# Patient Record
Sex: Male | Born: 1945 | Race: White | Hispanic: No | Marital: Married | Smoking: Former smoker
Health system: Southern US, Community
[De-identification: ages and names within clinical notes are randomized; demographics above are authoritative.]

## PROBLEM LIST (undated history)

## (undated) DIAGNOSIS — J449 Chronic obstructive pulmonary disease, unspecified: Secondary | ICD-10-CM

## (undated) DIAGNOSIS — I10 Essential (primary) hypertension: Secondary | ICD-10-CM

## (undated) HISTORY — PX: CLAVICLE SURGERY: SHX598

---

## 2014-02-11 DIAGNOSIS — J410 Simple chronic bronchitis: Secondary | ICD-10-CM | POA: Diagnosis not present

## 2014-08-21 ENCOUNTER — Ambulatory Visit: Payer: Self-pay

## 2014-08-21 DIAGNOSIS — R059 Cough, unspecified: Secondary | ICD-10-CM | POA: Diagnosis not present

## 2014-08-21 DIAGNOSIS — R5383 Other fatigue: Secondary | ICD-10-CM | POA: Diagnosis not present

## 2014-08-21 DIAGNOSIS — R5381 Other malaise: Secondary | ICD-10-CM | POA: Diagnosis not present

## 2014-08-21 DIAGNOSIS — R0602 Shortness of breath: Secondary | ICD-10-CM | POA: Diagnosis not present

## 2014-08-21 DIAGNOSIS — I10 Essential (primary) hypertension: Secondary | ICD-10-CM | POA: Diagnosis not present

## 2014-08-21 DIAGNOSIS — R05 Cough: Secondary | ICD-10-CM | POA: Diagnosis not present

## 2014-08-21 DIAGNOSIS — J069 Acute upper respiratory infection, unspecified: Secondary | ICD-10-CM | POA: Diagnosis not present

## 2014-08-27 DIAGNOSIS — R0602 Shortness of breath: Secondary | ICD-10-CM | POA: Diagnosis not present

## 2014-08-27 DIAGNOSIS — R5383 Other fatigue: Secondary | ICD-10-CM | POA: Diagnosis not present

## 2014-08-27 DIAGNOSIS — R5381 Other malaise: Secondary | ICD-10-CM | POA: Diagnosis not present

## 2014-08-27 DIAGNOSIS — Z125 Encounter for screening for malignant neoplasm of prostate: Secondary | ICD-10-CM | POA: Diagnosis not present

## 2014-08-27 DIAGNOSIS — Z0000 Encounter for general adult medical examination without abnormal findings: Secondary | ICD-10-CM | POA: Diagnosis not present

## 2014-09-18 DIAGNOSIS — R0602 Shortness of breath: Secondary | ICD-10-CM | POA: Diagnosis not present

## 2014-10-02 DIAGNOSIS — I10 Essential (primary) hypertension: Secondary | ICD-10-CM | POA: Diagnosis not present

## 2014-10-02 DIAGNOSIS — R0602 Shortness of breath: Secondary | ICD-10-CM | POA: Diagnosis not present

## 2014-10-02 DIAGNOSIS — J449 Chronic obstructive pulmonary disease, unspecified: Secondary | ICD-10-CM | POA: Diagnosis not present

## 2014-10-02 DIAGNOSIS — J069 Acute upper respiratory infection, unspecified: Secondary | ICD-10-CM | POA: Diagnosis not present

## 2014-10-15 DIAGNOSIS — R0602 Shortness of breath: Secondary | ICD-10-CM | POA: Diagnosis not present

## 2014-10-15 DIAGNOSIS — I10 Essential (primary) hypertension: Secondary | ICD-10-CM | POA: Diagnosis not present

## 2014-10-15 DIAGNOSIS — J449 Chronic obstructive pulmonary disease, unspecified: Secondary | ICD-10-CM | POA: Diagnosis not present

## 2014-10-15 DIAGNOSIS — R5381 Other malaise: Secondary | ICD-10-CM | POA: Diagnosis not present

## 2014-11-05 DIAGNOSIS — H33191 Other retinoschisis and retinal cysts, right eye: Secondary | ICD-10-CM | POA: Diagnosis not present

## 2014-11-05 DIAGNOSIS — H04123 Dry eye syndrome of bilateral lacrimal glands: Secondary | ICD-10-CM | POA: Diagnosis not present

## 2014-11-05 DIAGNOSIS — H2513 Age-related nuclear cataract, bilateral: Secondary | ICD-10-CM | POA: Diagnosis not present

## 2014-11-05 DIAGNOSIS — H5213 Myopia, bilateral: Secondary | ICD-10-CM | POA: Diagnosis not present

## 2014-11-19 DIAGNOSIS — I10 Essential (primary) hypertension: Secondary | ICD-10-CM | POA: Diagnosis not present

## 2014-11-19 DIAGNOSIS — J449 Chronic obstructive pulmonary disease, unspecified: Secondary | ICD-10-CM | POA: Diagnosis not present

## 2014-11-19 DIAGNOSIS — R0602 Shortness of breath: Secondary | ICD-10-CM | POA: Diagnosis not present

## 2015-03-20 DIAGNOSIS — G4710 Hypersomnia, unspecified: Secondary | ICD-10-CM | POA: Diagnosis not present

## 2015-03-20 DIAGNOSIS — I10 Essential (primary) hypertension: Secondary | ICD-10-CM | POA: Diagnosis not present

## 2015-03-20 DIAGNOSIS — J449 Chronic obstructive pulmonary disease, unspecified: Secondary | ICD-10-CM | POA: Diagnosis not present

## 2015-04-15 DIAGNOSIS — J449 Chronic obstructive pulmonary disease, unspecified: Secondary | ICD-10-CM | POA: Diagnosis not present

## 2015-04-15 DIAGNOSIS — G4710 Hypersomnia, unspecified: Secondary | ICD-10-CM | POA: Diagnosis not present

## 2015-04-15 DIAGNOSIS — I10 Essential (primary) hypertension: Secondary | ICD-10-CM | POA: Diagnosis not present

## 2015-04-22 DIAGNOSIS — G4710 Hypersomnia, unspecified: Secondary | ICD-10-CM | POA: Diagnosis not present

## 2015-05-01 DIAGNOSIS — J449 Chronic obstructive pulmonary disease, unspecified: Secondary | ICD-10-CM | POA: Diagnosis not present

## 2015-05-01 DIAGNOSIS — I10 Essential (primary) hypertension: Secondary | ICD-10-CM | POA: Diagnosis not present

## 2015-05-01 DIAGNOSIS — G4710 Hypersomnia, unspecified: Secondary | ICD-10-CM | POA: Diagnosis not present

## 2015-05-06 DIAGNOSIS — R0602 Shortness of breath: Secondary | ICD-10-CM | POA: Diagnosis not present

## 2015-05-06 DIAGNOSIS — I10 Essential (primary) hypertension: Secondary | ICD-10-CM | POA: Diagnosis not present

## 2015-05-06 DIAGNOSIS — G4710 Hypersomnia, unspecified: Secondary | ICD-10-CM | POA: Diagnosis not present

## 2015-05-06 DIAGNOSIS — J449 Chronic obstructive pulmonary disease, unspecified: Secondary | ICD-10-CM | POA: Diagnosis not present

## 2015-05-06 DIAGNOSIS — J069 Acute upper respiratory infection, unspecified: Secondary | ICD-10-CM | POA: Diagnosis not present

## 2015-05-22 DIAGNOSIS — J449 Chronic obstructive pulmonary disease, unspecified: Secondary | ICD-10-CM | POA: Diagnosis not present

## 2016-04-12 IMAGING — CR DG CHEST 2V
1 series · 2 of 2 positions shown · non-contrast
Comparison: None.

CLINICAL DATA: Productive cough and shortness of breath.

EXAM:
CHEST  2 VIEW

[Series 1: kdxr chest pa (or ap) and lat · 0.14mm/px · 2 of 2 slices shown]
[im 1/2]
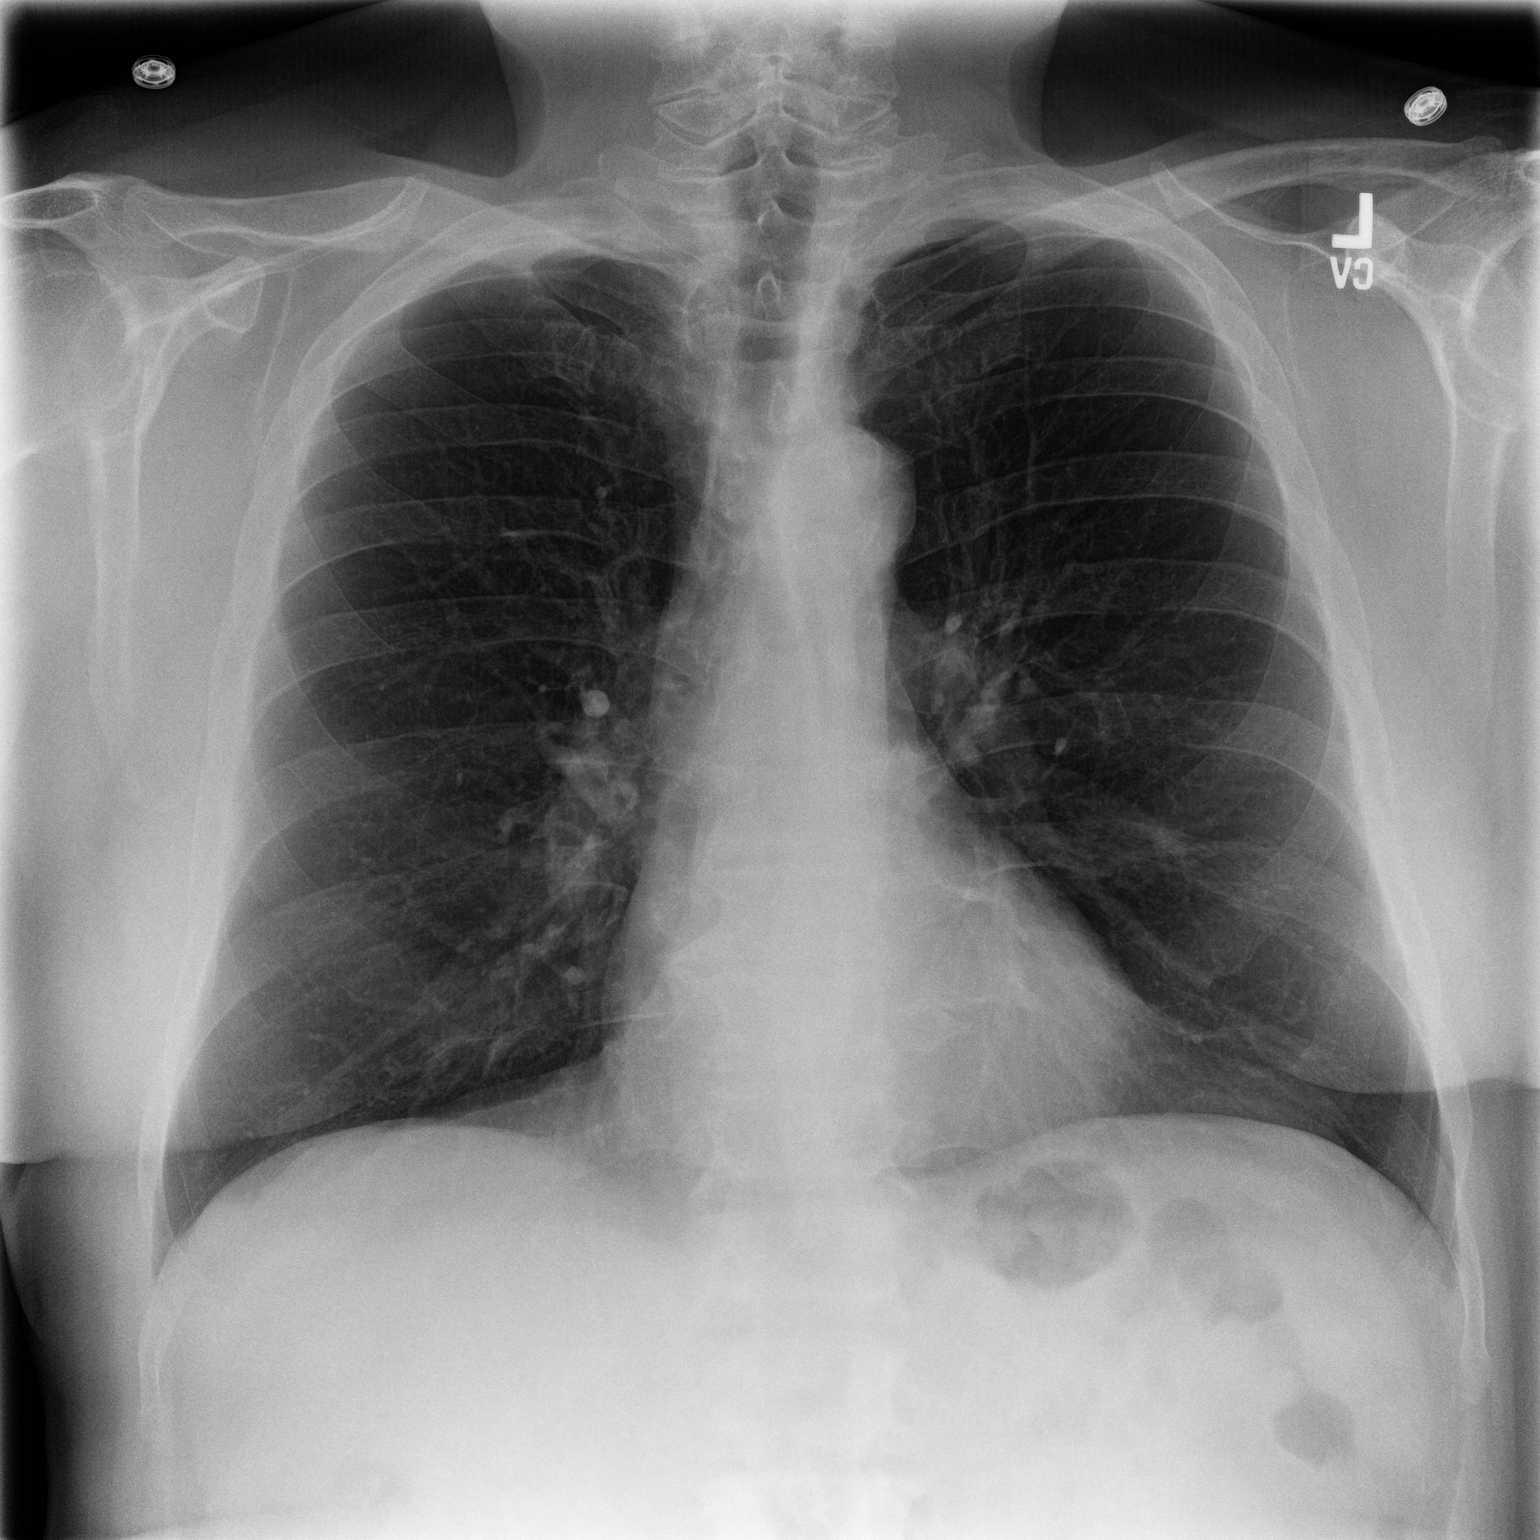
[im 2/2]
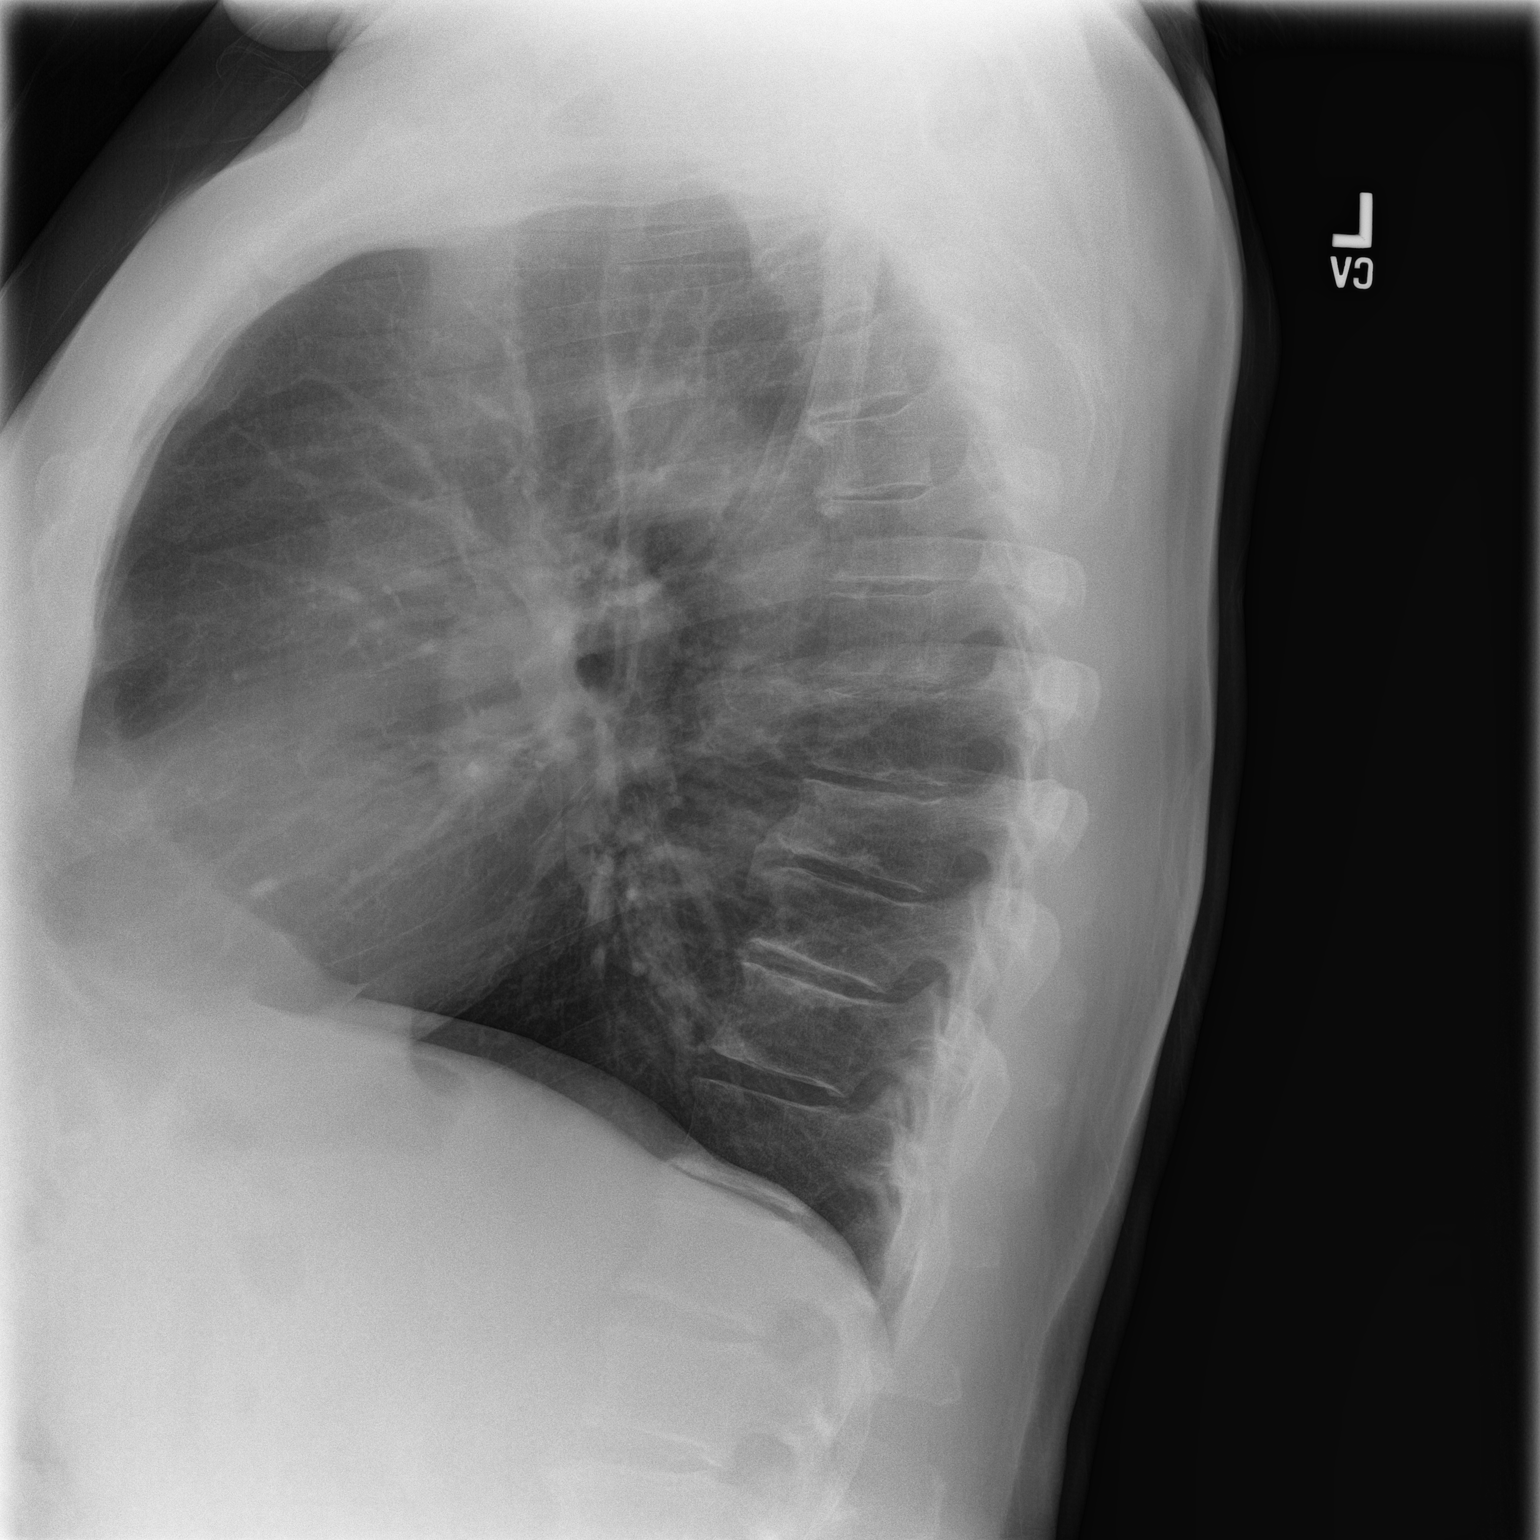

[2 of 2 positions shown; findings below may reference images not displayed]

FINDINGS: The heart size and mediastinal contours are within normal limits.
Both lungs are clear. There is mild degenerative change of the
spine.
IMPRESSION: No active cardiopulmonary disease.

## 2017-12-16 DIAGNOSIS — D2239 Melanocytic nevi of other parts of face: Secondary | ICD-10-CM | POA: Diagnosis not present

## 2017-12-16 DIAGNOSIS — L821 Other seborrheic keratosis: Secondary | ICD-10-CM | POA: Diagnosis not present

## 2017-12-16 DIAGNOSIS — L720 Epidermal cyst: Secondary | ICD-10-CM | POA: Diagnosis not present

## 2017-12-16 DIAGNOSIS — L578 Other skin changes due to chronic exposure to nonionizing radiation: Secondary | ICD-10-CM | POA: Diagnosis not present

## 2017-12-16 DIAGNOSIS — L570 Actinic keratosis: Secondary | ICD-10-CM | POA: Diagnosis not present

## 2017-12-16 DIAGNOSIS — L918 Other hypertrophic disorders of the skin: Secondary | ICD-10-CM | POA: Diagnosis not present

## 2022-03-29 ENCOUNTER — Encounter (HOSPITAL_COMMUNITY): Payer: Self-pay | Admitting: Emergency Medicine

## 2022-03-29 ENCOUNTER — Other Ambulatory Visit: Payer: Self-pay

## 2022-03-29 ENCOUNTER — Emergency Department (HOSPITAL_COMMUNITY)
Admission: EM | Admit: 2022-03-29 | Discharge: 2022-03-29 | Disposition: A | Discharge status: Home / Self Care | Payer: Medicare HMO | Attending: Emergency Medicine | Admitting: Emergency Medicine

## 2022-03-29 DIAGNOSIS — I10 Essential (primary) hypertension: Secondary | ICD-10-CM | POA: Diagnosis not present

## 2022-03-29 DIAGNOSIS — J449 Chronic obstructive pulmonary disease, unspecified: Secondary | ICD-10-CM | POA: Diagnosis not present

## 2022-03-29 DIAGNOSIS — I159 Secondary hypertension, unspecified: Secondary | ICD-10-CM

## 2022-03-29 DIAGNOSIS — R232 Flushing: Secondary | ICD-10-CM | POA: Diagnosis present

## 2022-03-29 HISTORY — DX: Chronic obstructive pulmonary disease, unspecified: J44.9

## 2022-03-29 HISTORY — DX: Essential (primary) hypertension: I10

## 2022-03-29 NOTE — Discharge Instructions (Signed)
You have been seen and discharged from the emergency department.  Your blood pressure has down trended.  It is very important that you are compliant with your nighttime blood pressure medication dose to control morning hypertension.  Follow-up with your primary provider for further evaluation and further care. Take home medications as prescribed. If you have any worsening symptoms or further concerns for your health please return to an emergency department for further evaluation. ?

## 2022-03-29 NOTE — ED Triage Notes (Signed)
Pt reports waking up this morning and feeling flushed/ "not right". Took BP at 5am and noted it was 210/80. Took amlodipine '10mg'$  po at 5:30. Seen at [facility] for bp management. Started recently on '50mg'$  of losartan at night, but still having difficulty managing bp. Denies sudden headache or altered vision.  ?

## 2022-03-29 NOTE — ED Provider Notes (Signed)
?[facility] ?Provider Note ? ? ?CSN: [id number] ?Arrival date & time: 03/29/22  3570 ? ?  ? ?History ? ?Chief Complaint  ?Patient presents with  ? Hypertension  ? ? ?Marvin Grabill is a 76 y.o. male. ? ?HPI ? ?76 year old male with past medical history of COPD, HTN presents emergency department after episode of feeling flushed.  Patient states when he woke up this morning at 5 AM to use the restroom he felt flushed.  Patient has been seen at the [facility] for hypertensive management.  He has recently been started on losartan at night which she has admittedly not been compliant with.  Patient states when he took his blood pressure this morning when he was feeling "flushed" that it was 210/80.  He denies any acute headache, focal neurologic symptoms, chest pain or shortness of breath.  He took his morning dose of amlodipine and came in for evaluation.  Currently he is asymptomatic with no acute complaints.  No recent fever or illness. ? ?Home Medications ?Prior to Admission medications   ?Not on File  ?   ? ?Allergies    ?Patient has no known allergies.   ? ?Review of Systems   ?Review of Systems  ?Constitutional:  Negative for fever.  ?Respiratory:  Negative for shortness of breath.   ?Cardiovascular:  Negative for chest pain, palpitations and leg swelling.  ?Gastrointestinal:  Negative for abdominal pain, diarrhea and vomiting.  ?Skin:  Negative for rash.  ?Neurological:  Negative for facial asymmetry, speech difficulty, weakness, numbness and headaches.  ? ?Physical Exam ?Updated Vital Signs ?BP (!) 171/71 (BP Location: Right Arm)   Pulse 66   Temp 98.6 ?F (37 ?C) (Oral)   Resp (!) 9   Ht '5\' 9"'$  (1.753 m)   Wt 92.5 kg   SpO2 99%   BMI 30.13 kg/m?  ?Physical Exam ?Vitals and nursing note reviewed.  ?Constitutional:   ?   General: He is not in acute distress. ?   Appearance: Normal appearance. He is not diaphoretic.  ?HENT:  ?   Head: Normocephalic.  ?   Mouth/Throat:  ?   Mouth: Mucous  membranes are moist.  ?Cardiovascular:  ?   Rate and Rhythm: Normal rate.  ?Pulmonary:  ?   Effort: Pulmonary effort is normal. No respiratory distress.  ?Abdominal:  ?   Palpations: Abdomen is soft.  ?   Tenderness: There is no abdominal tenderness.  ?Musculoskeletal:     ?   General: No swelling.  ?Skin: ?   General: Skin is warm.  ?Neurological:  ?   General: No focal deficit present.  ?   Mental Status: He is alert and oriented to person, place, and time. Mental status is at baseline.  ?Psychiatric:     ?   Mood and Affect: Mood normal.  ? ? ?ED Results / Procedures / Treatments   ?Labs ?(all labs ordered are listed, but only abnormal results are displayed) ?Labs Reviewed - No data to display ? ?EKG ?EKG Interpretation ? ?Date/Time:  Monday March 29 2022 07:32:28 EDT ?Ventricular Rate:  66 ?PR Interval:  194 ?QRS Duration: 120 ?QT Interval:  407 ?QTC Calculation: 427 ?R Axis:   101 ?Text Interpretation: Sinus rhythm Ventricular premature complex IVCD, consider atypical RBBB Anterior infarct, old Baseline wander in lead(s) V5 Confirmed by Lavenia Atlas [phone]) on 03/29/2022 9:23:47 AM ? ?Radiology ?No results found. ? ?Procedures ?Procedures  ? ? ?Medications Ordered in ED ?Medications - No data to display ? ?  ED Course/ Medical Decision Making/ A&P ?  ?                        ?Medical Decision Making ? ?76 year old male presents emergency department for evaluation of HTN.  Recently started on nightly dose of losartan, he has admittedly been noncompliant with this.  States he woke up this morning, felt flushed, blood pressure was elevated with systolic over 993.  No acute symptoms in regards to the HTN including headache, neuro symptoms/deficit, chest pain, shortness of breath.  He is otherwise been in his usual state of health. ? ?He took a dose of his amlodipine prior to arrival, came in for evaluation.  Blood pressure has appropriately down trended without any intervention here in the department.  Currently he  is 570 systolic.  Again is asymptomatic with no complaints.  EKG shows PVC and wandering baseline, nonspecific ST changes but no other acute findings.  No active chest pain, low suspicion for ACS. ? ?Patient at this time appears safe and stable for discharge and close outpatient follow up. Discharge plan and strict return to ED precautions discussed, patient verbalizes understanding and agreement. ? ? ? ? ? ? ? ?Final Clinical Impression(s) / ED Diagnoses ?Final diagnoses:  ?Secondary hypertension  ? ? ?Rx / DC Orders ?ED Discharge Orders   ? ? None  ? ?  ? ? ?  ?Lorelle Gibbs, DO ?03/29/22 1779 ? ?

## 2022-10-22 ENCOUNTER — Other Ambulatory Visit: Payer: Self-pay | Admitting: Family Medicine

## 2022-10-22 DIAGNOSIS — R0989 Other specified symptoms and signs involving the circulatory and respiratory systems: Secondary | ICD-10-CM

## 2022-11-01 ENCOUNTER — Ambulatory Visit (HOSPITAL_COMMUNITY)
Admission: RE | Admit: 2022-11-01 | Discharge: 2022-11-01 | Disposition: A | Discharge status: Home / Self Care | Payer: No Typology Code available for payment source | Source: Ambulatory Visit | Attending: Family Medicine | Admitting: Family Medicine

## 2022-11-01 DIAGNOSIS — R0989 Other specified symptoms and signs involving the circulatory and respiratory systems: Secondary | ICD-10-CM | POA: Insufficient documentation

## 2022-11-06 ENCOUNTER — Other Ambulatory Visit: Payer: Self-pay

## 2022-11-06 ENCOUNTER — Emergency Department (HOSPITAL_COMMUNITY)
Admission: EM | Admit: 2022-11-06 | Discharge: 2022-11-06 | Disposition: A | Discharge status: Home / Self Care | Payer: No Typology Code available for payment source | Attending: Emergency Medicine | Admitting: Emergency Medicine

## 2022-11-06 ENCOUNTER — Emergency Department (HOSPITAL_COMMUNITY): Payer: No Typology Code available for payment source

## 2022-11-06 ENCOUNTER — Encounter (HOSPITAL_COMMUNITY): Payer: Self-pay

## 2022-11-06 DIAGNOSIS — U071 COVID-19: Secondary | ICD-10-CM | POA: Insufficient documentation

## 2022-11-06 DIAGNOSIS — I10 Essential (primary) hypertension: Secondary | ICD-10-CM | POA: Diagnosis not present

## 2022-11-06 DIAGNOSIS — I160 Hypertensive urgency: Secondary | ICD-10-CM | POA: Diagnosis not present

## 2022-11-06 DIAGNOSIS — J449 Chronic obstructive pulmonary disease, unspecified: Secondary | ICD-10-CM | POA: Insufficient documentation

## 2022-11-06 DIAGNOSIS — J029 Acute pharyngitis, unspecified: Secondary | ICD-10-CM | POA: Diagnosis present

## 2022-11-06 LAB — CBC
HCT: 44.8 % (ref 39.0–52.0)
Hemoglobin: 15.4 g/dL (ref 13.0–17.0)
MCH: 32.3 pg (ref 26.0–34.0)
MCHC: 34.4 g/dL (ref 30.0–36.0)
MCV: 93.9 fL (ref 80.0–100.0)
Platelets: 238 10*3/uL (ref 150–400)
RBC: 4.77 MIL/uL (ref 4.22–5.81)
RDW: 12.6 % (ref 11.5–15.5)
WBC: 8.9 10*3/uL (ref 4.0–10.5)
nRBC: 0 % (ref 0.0–0.2)

## 2022-11-06 LAB — BASIC METABOLIC PANEL
Anion gap: 10 (ref 5–15)
BUN: 17 mg/dL (ref 8–23)
CO2: 24 mmol/L (ref 22–32)
Calcium: 8.8 mg/dL — ABNORMAL LOW (ref 8.9–10.3)
Chloride: 97 mmol/L — ABNORMAL LOW (ref 98–111)
Creatinine, Ser: 0.87 mg/dL (ref 0.61–1.24)
GFR, Estimated: >60 mL/min (ref >60)
Glucose, Bld: 110 mg/dL — ABNORMAL HIGH (ref 70–99)
Potassium: 3.3 mmol/L — ABNORMAL LOW (ref 3.5–5.1)
Sodium: 131 mmol/L — ABNORMAL LOW (ref 135–145)

## 2022-11-06 LAB — RESP PANEL BY RT-PCR (FLU A&B, COVID) ARPGX2
Influenza A by PCR: NEGATIVE
Influenza B by PCR: NEGATIVE
SARS Coronavirus 2 by RT PCR: POSITIVE — AB

## 2022-11-06 LAB — TROPONIN I (HIGH SENSITIVITY)
Troponin I (High Sensitivity): 5 ng/L (ref <18)
Troponin I (High Sensitivity): 6 ng/L (ref <18)

## 2022-11-06 MED ORDER — AMLODIPINE BESYLATE 5 MG PO TABS: 10.0000 mg | ORAL_TABLET | Freq: Once | ORAL | Status: DC

## 2022-11-06 MED ORDER — SODIUM CHLORIDE 0.9% FLUSH: 3.0000 mL | Freq: Once | INTRAVENOUS | Status: DC

## 2022-11-06 NOTE — Discharge Instructions (Signed)
You have been evaluated for your symptoms.  Your blood pressure is high today, sometimes that can cause chest discomfort.  Please discuss this with your doctor and resume taking blood pressure medication to help with your symptoms.  You also tested positive for COVID infection.  Follow instruction below.  Recommendations for at home COVID-19 symptoms management:  Please continue isolation at home.  If have acute worsening of symptoms please go to ER/urgent care for further evaluation. Check pulse oximetry and if below 90-92% please go to ER. The following supplements MAY help:  Vitamin C '500mg'$  twice a day and Quercetin 250-500 mg twice a day Vitamin D3 2000 - 4000 u/day B Complex vitamins Zinc 75-100 mg/day Melatonin 6-10 mg at night (the optimal dose is unknown) Aspirin '81mg'$ /day (if no history of bleeding issues)

## 2022-11-06 NOTE — ED Triage Notes (Signed)
Patient arrives to ED from home c/o chest pain in the middle of his chest that started earlier today around 1130 this morning. Pt states he "feels like indigestion or gas bubble" in the middle of his chest. Pt a&o x4, NAD. Pt has hx of HTN.

## 2022-11-06 NOTE — ED Provider Notes (Signed)
[facility] EMERGENCY DEPARTMENT Provider Note   CSN: [id number] Arrival date & time: 11/06/22  1656     History  Chief Complaint  Patient presents with   Chest Pain    Curtis Sullivan is a 76 y.o. male.  The history is provided by the patient and medical records. No language interpreter was used.  Chest Pain    76 year old male significant history of hypertension, COPD, presenting with complaints of chest pain.  Patient mention he experiencing some indigestion throughout the day today.  He described more as a gas-like bubble in his mid chest that was causing discomfort.  After drinking a soda and belching resolved.  Symptoms symptoms came back again an hour later prompting this ER visit.  Now that he is here he mention his symptom has since resolved.  He did not endorse any associated lightheadedness, dizziness, nausea, shortness of breath, diaphoresis.  He mention he has had similar episodes like this in the past.  He denies any exertional chest pain.  He does not smoke or drink, he has not had a stress test in quite a while he reported he is currently symptom-free.  He did report having some flulike symptoms earlier this morning with sore throat and some headache.  He rinse his throat with water and salt and report sore throat resolved as well.  Home Medications Prior to Admission medications   Not on File      Allergies    Patient has no known allergies.    Review of Systems   Review of Systems  Cardiovascular:  Positive for chest pain.  All other systems reviewed and are negative.   Physical Exam Updated Vital Signs BP (!) 165/80   Pulse 80   Temp 99.7 F (37.6 C) (Oral)   Resp (!) 26   Ht 5\' 9"  (1.753 m)   Wt 86.2 kg   SpO2 92%   BMI 28.06 kg/m  Physical Exam Vitals and nursing note reviewed.  Constitutional:      General: He is not in acute distress.    Appearance: He is well-developed.  HENT:     Head: Atraumatic.  Eyes:     Conjunctiva/sclera:  Conjunctivae normal.  Cardiovascular:     Rate and Rhythm: Normal rate and regular rhythm.     Pulses: Normal pulses.     Heart sounds: Normal heart sounds.  Pulmonary:     Effort: Pulmonary effort is normal.     Breath sounds: Normal breath sounds.  Abdominal:     Palpations: Abdomen is soft.     Tenderness: There is no abdominal tenderness.  Musculoskeletal:     Cervical back: Neck supple.     Right lower leg: No edema.     Left lower leg: No edema.  Skin:    Findings: No rash.  Neurological:     Mental Status: He is alert. Mental status is at baseline.     ED Results / Procedures / Treatments   Labs (all labs ordered are listed, but only abnormal results are displayed) Labs Reviewed  RESP PANEL BY RT-PCR (FLU A&B, COVID) ARPGX2 - Abnormal; Notable for the following components:      Result Value   SARS Coronavirus 2 by RT PCR POSITIVE (*)    All other components within normal limits  BASIC METABOLIC PANEL - Abnormal; Notable for the following components:   Sodium 131 (*)    Potassium 3.3 (*)    Chloride 97 (*)  Glucose, Bld 110 (*)    Calcium 8.8 (*)    All other components within normal limits  CBC  TROPONIN I (HIGH SENSITIVITY)  TROPONIN I (HIGH SENSITIVITY)    EKG EKG Interpretation  Date/Time:  Saturday November 06 2022 17:21:19 EST Ventricular Rate:  85 PR Interval:  212 QRS Duration: 120 QT Interval:  374 QTC Calculation: 445 R Axis:   11 Text Interpretation: Sinus rhythm with 1st degree A-V block Septal infarct , age undetermined Abnormal ECG When compared with ECG of 29-Mar-2022 07:32, PREVIOUS ECG IS PRESENT No acute changes No significant change since last tracing Confirmed by Derwood Kaplan [phone]) on 11/06/2022 6:12:00 PM  Radiology DG Chest 2 View  Result Date: 11/06/2022 CLINICAL DATA:  Chest pain EXAM: CHEST - 2 VIEW COMPARISON:  08/21/2014 FINDINGS: Heart and mediastinal contours are within normal limits. No focal opacities or effusions.  No acute bony abnormality. IMPRESSION: No active cardiopulmonary disease. Electronically Signed   By: Charlett Nose M.D.   On: 11/06/2022 20:24    Procedures Procedures    Medications Ordered in ED Medications  sodium chloride flush (NS) 0.9 % injection 3 mL (3 mLs Intravenous Not Given 11/06/22 1729)  amLODipine (NORVASC) tablet 10 mg (10 mg Oral Patient Refused/Not Given 11/06/22 2007)    ED Course/ Medical Decision Making/ A&P                           Medical Decision Making Amount and/or Complexity of Data Reviewed Labs: ordered. Radiology: ordered.  Risk Prescription drug management.   BP (!) 165/80   Pulse 80   Temp 99.7 F (37.6 C) (Oral)   Resp (!) 26   Ht 5\' 9"  (1.753 m)   Wt 86.2 kg   SpO2 92%   BMI 28.06 kg/m   59:79 PM  76 year old male significant history of hypertension, COPD, presenting with complaints of chest pain.  Patient mention he experiencing some indigestion throughout the day today.  He described more as a gas-like bubble in his mid chest that was causing discomfort.  After drinking a soda and belching resolved.  Symptoms symptoms came back again an hour later prompting this ER visit.  Now that he is here he mention his symptom has since resolved.  He did not endorse any associated lightheadedness, dizziness, nausea, shortness of breath, diaphoresis.  He mention he has had similar episodes like this in the past.  He denies any exertional chest pain.  He does not smoke or drink, he has not had a stress test in quite a while he reported he is currently symptom-free.  He did report having some flulike symptoms earlier this morning with sore throat and some headache.  He rinse his throat with water and salt and report sore throat resolved as well.  On exam this is a well-appearing elderly male appears to be in no acute discomfort.  No reproducible chest pain.  Heart lung sounds normal, abdomen is soft nontender.  No evidence of peripheral edema.  Vitals are  remarkable for elevated blood pressure 165/80.  Oral temperature is 99.7.  O2 sats 92%.  -Labs ordered, independently viewed and interpreted by me.  Labs remarkable for Covid positive, normal delta trop doubt ACS causing his sxs -The patient was maintained on a cardiac monitor.  I personally viewed and interpreted the cardiac monitored which showed an underlying rhythm of: NSR -Imaging independently viewed and interpreted by me and I agree with  radiologist's interpretation.  Result remarkable for CXR showing no focal infiltrates -This patient presents to the ED for concern of CP, this involves an extensive number of treatment options, and is a complaint that carries with it a high risk of complications and morbidity.  The differential diagnosis includes COVID, costochondritis, pna, pleural effusion, acs, PE -Co morbidities that complicate the patient evaluation includes HTN, COPD -Treatment offered include amlodipine and IVF but pt declined -Reevaluation of the patient after these medicines showed that the patient stayed the same -PCP office notes or outside notes reviewed -Discussion with attending DR. Nanavati -Escalation to admission/observation considered: patients feels much better, is comfortable with discharge, and will follow up with PCP -Prescription medication considered, patient comfortable with OTC meds -Social Determinant of Health considered which includes tobacco use  9:42 PM Work-up today is remarkable for positive COVID infection.  Chest x-ray without signs of pneumonia, vital signs stable, delta troponin negative doubt ACS.  Since patient is a good candidate for Paxlovid antiviral medication I did discuss this with patient but he declined the medication.  Patient was noted to have elevated blood pressure as high as 206 systolic.  On recheck it has improved to 165 systolic.  I encourage patient to follow-up with his doctor and resume his blood pressure medication as he no longer take  amlodipine that was previously prescribed.  Patient states he does not like the side effect of the medication.  I encouraged patient to try other medication.  I also offer medication for better control of his uncontrolled hypertension but patient declined.  [facility]  Return precaution given.  Recommend self quarantine.         Final Clinical Impression(s) / ED Diagnoses Final diagnoses:  COVID-19 virus infection  Hypertensive urgency    Rx / DC Orders ED Discharge Orders     None         Fayrene Helper, PA-C 11/06/22 2145    Derwood Kaplan, MD 11/08/22 0001

## 2022-11-09 ENCOUNTER — Telehealth: Payer: Self-pay | Admitting: *Deleted

## 2022-11-09 NOTE — Telephone Encounter (Signed)
     Patient  visit on 11/06/2022  at [facility]  was for covid 19   Have you been able to follow up with your primary care physician? Patient was Covid positive and is feeling better and has no more fever and headache is resolved will see the dr in [place] about his BP  The patient was able to obtain any needed medicine or equipment.  Are there diet recommendations that you are having difficulty following?  Patient expresses understanding of discharge instructions and education provided has no other needs at this time.   [facility] [phone] [address] , [place] [address] Email : [email or web address]

## 2024-06-02 ENCOUNTER — Other Ambulatory Visit: Payer: Self-pay

## 2024-06-02 ENCOUNTER — Observation Stay

## 2024-06-02 ENCOUNTER — Inpatient Hospital Stay
Admission: EM | Admit: 2024-06-02 | Discharge: 2024-06-05 | DRG: 603 | Disposition: A | Discharge status: Home / Self Care | Attending: Internal Medicine | Admitting: Internal Medicine

## 2024-06-02 DIAGNOSIS — E785 Hyperlipidemia, unspecified: Secondary | ICD-10-CM | POA: Diagnosis present

## 2024-06-02 DIAGNOSIS — Z79899 Other long term (current) drug therapy: Secondary | ICD-10-CM | POA: Diagnosis not present

## 2024-06-02 DIAGNOSIS — R54 Age-related physical debility: Secondary | ICD-10-CM | POA: Diagnosis present

## 2024-06-02 DIAGNOSIS — I10 Essential (primary) hypertension: Secondary | ICD-10-CM | POA: Diagnosis present

## 2024-06-02 DIAGNOSIS — Z7951 Long term (current) use of inhaled steroids: Secondary | ICD-10-CM | POA: Diagnosis not present

## 2024-06-02 DIAGNOSIS — L03115 Cellulitis of right lower limb: Principal | ICD-10-CM | POA: Diagnosis present

## 2024-06-02 DIAGNOSIS — Z8249 Family history of ischemic heart disease and other diseases of the circulatory system: Secondary | ICD-10-CM | POA: Diagnosis not present

## 2024-06-02 DIAGNOSIS — M7989 Other specified soft tissue disorders: Secondary | ICD-10-CM | POA: Diagnosis not present

## 2024-06-02 DIAGNOSIS — J449 Chronic obstructive pulmonary disease, unspecified: Secondary | ICD-10-CM | POA: Diagnosis present

## 2024-06-02 DIAGNOSIS — I160 Hypertensive urgency: Secondary | ICD-10-CM | POA: Diagnosis present

## 2024-06-02 DIAGNOSIS — R0600 Dyspnea, unspecified: Secondary | ICD-10-CM | POA: Diagnosis not present

## 2024-06-02 DIAGNOSIS — Z87891 Personal history of nicotine dependence: Secondary | ICD-10-CM | POA: Diagnosis not present

## 2024-06-02 LAB — LACTIC ACID, PLASMA: Lactic Acid, Venous: 1.2 mmol/L (ref 0.5–1.9)

## 2024-06-02 LAB — COMPREHENSIVE METABOLIC PANEL WITH GFR
ALT: 10 U/L (ref 0–44)
AST: 19 U/L (ref 15–41)
Albumin: 3.8 g/dL (ref 3.5–5.0)
Alkaline Phosphatase: 65 U/L (ref 38–126)
Anion gap: 9 (ref 5–15)
BUN: 17 mg/dL (ref 8–23)
CO2: 25 mmol/L (ref 22–32)
Calcium: 8.8 mg/dL — ABNORMAL LOW (ref 8.9–10.3)
Chloride: 105 mmol/L (ref 98–111)
Creatinine, Ser: 0.66 mg/dL (ref 0.61–1.24)
GFR, Estimated: >60 mL/min (ref >60)
Glucose, Bld: 101 mg/dL — ABNORMAL HIGH (ref 70–99)
Potassium: 3.9 mmol/L (ref 3.5–5.1)
Sodium: 139 mmol/L (ref 135–145)
Total Bilirubin: 0.6 mg/dL (ref 0.0–1.2)
Total Protein: 6.7 g/dL (ref 6.5–8.1)

## 2024-06-02 LAB — CBC WITH DIFFERENTIAL/PLATELET
Abs Immature Granulocytes: 0.01 10*3/uL (ref 0.00–0.07)
Basophils Absolute: 0.1 10*3/uL (ref 0.0–0.1)
Basophils Relative: 1 %
Eosinophils Absolute: 0.7 10*3/uL — ABNORMAL HIGH (ref 0.0–0.5)
Eosinophils Relative: 9 %
HCT: 44.2 % (ref 39.0–52.0)
Hemoglobin: 14.8 g/dL (ref 13.0–17.0)
Immature Granulocytes: 0 %
Lymphocytes Relative: 23 %
Lymphs Abs: 1.8 10*3/uL (ref 0.7–4.0)
MCH: 31.9 pg (ref 26.0–34.0)
MCHC: 33.5 g/dL (ref 30.0–36.0)
MCV: 95.3 fL (ref 80.0–100.0)
Monocytes Absolute: 0.7 10*3/uL (ref 0.1–1.0)
Monocytes Relative: 8 %
Neutro Abs: 4.6 10*3/uL (ref 1.7–7.7)
Neutrophils Relative %: 59 %
Platelets: 282 10*3/uL (ref 150–400)
RBC: 4.64 MIL/uL (ref 4.22–5.81)
RDW: 12.9 % (ref 11.5–15.5)
WBC: 7.9 10*3/uL (ref 4.0–10.5)
nRBC: 0 % (ref 0.0–0.2)

## 2024-06-02 MED ORDER — MELATONIN 5 MG PO TABS: 5.0000 mg | ORAL_TABLET | Freq: Every evening | ORAL | Status: DC | PRN

## 2024-06-02 MED ORDER — ACETAMINOPHEN 325 MG PO TABS: 650.0000 mg | ORAL_TABLET | Freq: Four times a day (QID) | ORAL | Status: DC | PRN

## 2024-06-02 MED ORDER — SENNOSIDES-DOCUSATE SODIUM 8.6-50 MG PO TABS: 1.0000 | ORAL_TABLET | Freq: Every evening | ORAL | Status: DC | PRN

## 2024-06-02 MED ORDER — AMLODIPINE BESYLATE 10 MG PO TABS
10.0000 mg | ORAL_TABLET | Freq: Every day | ORAL | Status: DC
  Administered 2024-06-03: 10 mg via ORAL
  Filled 2024-06-02 (×2): qty 1

## 2024-06-02 MED ORDER — VITAMIN B-12 1000 MCG PO TABS
1000.0000 ug | ORAL_TABLET | Freq: Every day | ORAL | Status: DC
  Administered 2024-06-03 – 2024-06-05 (×3): 1000 ug via ORAL
  Filled 2024-06-02 (×3): qty 1

## 2024-06-02 MED ORDER — HYDRALAZINE HCL 20 MG/ML IJ SOLN
INTRAMUSCULAR | Status: AC
  Filled 2024-06-02: qty 1

## 2024-06-02 MED ORDER — ZINC SULFATE 220 (50 ZN) MG PO CAPS
220.0000 mg | ORAL_CAPSULE | Freq: Every day | ORAL | Status: DC
  Administered 2024-06-03 – 2024-06-05 (×3): 220 mg via ORAL
  Filled 2024-06-02 (×3): qty 1

## 2024-06-02 MED ORDER — HYDRALAZINE HCL 20 MG/ML IJ SOLN: 5.0000 mg | Freq: Four times a day (QID) | INTRAMUSCULAR | Status: DC | PRN

## 2024-06-02 MED ORDER — VITAMIN C 500 MG PO TABS
500.0000 mg | ORAL_TABLET | Freq: Two times a day (BID) | ORAL | Status: DC
  Administered 2024-06-02 – 2024-06-05 (×6): 500 mg via ORAL
  Filled 2024-06-02 (×6): qty 1

## 2024-06-02 MED ORDER — VANCOMYCIN HCL 2000 MG/400ML IV SOLN
2000.0000 mg | Freq: Once | INTRAVENOUS | Status: DC
  Filled 2024-06-02: qty 400

## 2024-06-02 MED ORDER — SODIUM CHLORIDE 0.9 % IV SOLN: INTRAVENOUS | Status: DC

## 2024-06-02 MED ORDER — HEPARIN SODIUM (PORCINE) 5000 UNIT/ML IJ SOLN
5000.0000 [IU] | Freq: Three times a day (TID) | INTRAMUSCULAR | Status: DC
  Administered 2024-06-02 – 2024-06-05 (×8): 5000 [IU] via SUBCUTANEOUS
  Filled 2024-06-02 (×7): qty 1

## 2024-06-02 MED ORDER — ACETAMINOPHEN 650 MG RE SUPP
650.0000 mg | Freq: Four times a day (QID) | RECTAL | Status: AC | PRN
Start: 2024-06-02 — End: 2024-06-07

## 2024-06-02 MED ORDER — ENALAPRILAT 1.25 MG/ML IV SOLN
0.6250 mg | Freq: Four times a day (QID) | INTRAVENOUS | Status: DC | PRN
  Administered 2024-06-03: 0.625 mg via INTRAVENOUS
  Filled 2024-06-02 (×3): qty 0.5

## 2024-06-02 MED ORDER — ONDANSETRON HCL 4 MG PO TABS: 4.0000 mg | ORAL_TABLET | Freq: Four times a day (QID) | ORAL | Status: DC | PRN

## 2024-06-02 MED ORDER — HYDRALAZINE HCL 20 MG/ML IJ SOLN
20.0000 mg | Freq: Once | INTRAMUSCULAR | Status: AC
  Administered 2024-06-02: 20 mg via INTRAVENOUS

## 2024-06-02 MED ORDER — SODIUM CHLORIDE 0.9 % IV SOLN
2.0000 g | INTRAVENOUS | Status: DC
  Administered 2024-06-03 – 2024-06-05 (×3): 2 g via INTRAVENOUS
  Filled 2024-06-02 (×3): qty 20

## 2024-06-02 MED ORDER — ALBUTEROL SULFATE HFA 108 (90 BASE) MCG/ACT IN AERS: 2.0000 | INHALATION_SPRAY | Freq: Two times a day (BID) | RESPIRATORY_TRACT | Status: DC | PRN

## 2024-06-02 MED ORDER — HYDRALAZINE HCL 20 MG/ML IJ SOLN
10.0000 mg | Freq: Four times a day (QID) | INTRAMUSCULAR | Status: DC | PRN
  Administered 2024-06-03 (×2): 10 mg via INTRAVENOUS
  Filled 2024-06-02 (×2): qty 1

## 2024-06-02 MED ORDER — ONDANSETRON HCL 4 MG/2ML IJ SOLN: 4.0000 mg | Freq: Four times a day (QID) | INTRAMUSCULAR | Status: DC | PRN

## 2024-06-02 MED ORDER — ALBUTEROL SULFATE (2.5 MG/3ML) 0.083% IN NEBU: 2.5000 mg | INHALATION_SOLUTION | Freq: Two times a day (BID) | RESPIRATORY_TRACT | Status: DC | PRN

## 2024-06-02 MED ORDER — SODIUM CHLORIDE 0.9 % IV SOLN
2.0000 g | Freq: Once | INTRAVENOUS | Status: AC
  Administered 2024-06-02: 2 g via INTRAVENOUS
  Filled 2024-06-02: qty 20

## 2024-06-02 MED ORDER — VANCOMYCIN HCL 1500 MG/300ML IV SOLN: 1500.0000 mg | INTRAVENOUS | Status: DC

## 2024-06-02 NOTE — ED Triage Notes (Signed)
 To ED for fever since this morning, has cellulitis to RLE and foot. Has been on 2 separate 10 day abx courses. Temp is 98, no antipyretics today. Has not taken temp at home.

## 2024-06-02 NOTE — Assessment & Plan Note (Signed)
 Patient does not take his home statin medication

## 2024-06-02 NOTE — ED Provider Notes (Signed)
 [facility] Provider Note    Event Date/Time   First MD Initiated Contact with Patient 06/02/24 1622     (approximate)   History   Cellulitis and Fever   HPI Curtis Sullivan is a 78 y.o. male presenting today for infection to his right leg.  Patient states he originally had a fall several months ago and had a small wound above his right ankle on the outside.  In the past several weeks he started getting redness around the area in his foot.  Seen at the [facility] and underwent course of doxycycline with no improvement and then started on Keflex with no improvement.  Presents here today for ongoing symptoms.  Redness noted throughout the right lower extremity.  Subjective fevers at home.  No sweating or difficulty breathing.  No history of blood clots.     Physical Exam   Triage Vital Signs: ED Triage Vitals  Encounter Vitals Group     BP 06/02/24 1455 (!) 124/90     Girls Systolic BP Percentile --      Girls Diastolic BP Percentile --      Boys Systolic BP Percentile --      Boys Diastolic BP Percentile --      Pulse Rate 06/02/24 1455 72     Resp 06/02/24 1455 20     Temp 06/02/24 1455 98 F (36.7 C)     Temp Source 06/02/24 1455 Oral     SpO2 06/02/24 1455 97 %     Weight 06/02/24 1452 205 lb (93 kg)     Height 06/02/24 1452 5' 9 (1.753 m)     Head Circumference --      Peak Flow --      Pain Score 06/02/24 1452 1     Pain Loc --      Pain Education --      Exclude from Growth Chart --     Most recent vital signs: Vitals:   06/02/24 1455  BP: (!) 124/90  Pulse: 72  Resp: 20  Temp: 98 F (36.7 C)  SpO2: 97%   I have reviewed the vital signs. General:  Awake, alert, no acute distress. Head:  Normocephalic, Atraumatic. EENT:  PERRL, EOMI, Oral mucosa pink and moist, Neck is supple. Cardiovascular: Regular rate, 2+ distal pulses. Respiratory:  Normal respiratory effort, symmetrical expansion, no distress.   Extremities:  Moving all  four extremities through full ROM without pain.   Neuro:  Alert and oriented.  Interacting appropriately.   Skin: Erythematous and swollen right leg with warmth originating from wound to right lateral lower leg Psych: Appropriate affect.    ED Results / Procedures / Treatments   Labs (all labs ordered are listed, but only abnormal results are displayed) Labs Reviewed  COMPREHENSIVE METABOLIC PANEL WITH GFR - Abnormal; Notable for the following components:      Result Value   Glucose, Bld 101 (*)    Calcium 8.8 (*)    All other components within normal limits  CBC WITH DIFFERENTIAL/PLATELET - Abnormal; Notable for the following components:   Eosinophils Absolute 0.7 (*)    All other components within normal limits  LACTIC ACID, PLASMA     EKG    RADIOLOGY    PROCEDURES:  Critical Care performed: No  Procedures   MEDICATIONS ORDERED IN ED: Medications  cefTRIAXone (ROCEPHIN) 2 g in sodium chloride  0.9 % 100 mL IVPB (has no administration in time range)  IMPRESSION / MDM / ASSESSMENT AND PLAN / ED COURSE  I reviewed the triage vital signs and the nursing notes.                              Differential diagnosis includes, but is not limited to, cellulitis, lower suspicion for abscess or blood clot  Patient's presentation is most consistent with acute complicated illness / injury requiring diagnostic workup.  Patient is a 78 year old male presenting today for redness and swelling to right lower extremity.  Has been treated with 2 different oral antibiotics outpatient for cellulitis with no improvement in symptoms.  Given ongoing symptoms and clinically appears to still have cellulitis on exam, will start on IV antibiotics and admit for further care.  Laboratory workup otherwise reassuring and no evidence of sepsis at this time.  Started on ceftriaxone and vancomycin.  The patient is on the cardiac monitor to evaluate for evidence of arrhythmia and/or significant  heart rate changes.     FINAL CLINICAL IMPRESSION(S) / ED DIAGNOSES   Final diagnoses:  Cellulitis of right leg     Rx / DC Orders   ED Discharge Orders     None        Note:  This document was prepared using Dragon voice recognition software and may include unintentional dictation errors.   Kandee Orion, MD 06/02/24 [phone]

## 2024-06-02 NOTE — Consult Note (Signed)
 Pharmacy Antibiotic Note  Curtis Sullivan is a 78 y.o. male admitted on 06/02/2024 with cellulitis.  Pharmacy has been consulted for Vancomycin dosing.  Plan: Ceftriaxone ordered by MD Vancomycin 2000 mg IV x 1 dose, followed by  1500mg  IV q 24hrs Goal AUC 400-550. Expected AUC: 462.9 SCr used: 0.8 (0.66) Expected Cmin: 11  Height: 5' 9 (175.3 cm) Weight: 93 kg (205 lb) IBW/kg (Calculated) : 70.7  Temp (24hrs), Avg:98 F (36.7 C), Min:98 F (36.7 C), Max:98 F (36.7 C)  Recent Labs  Lab 06/02/24 1456  WBC 7.9  CREATININE 0.66  LATICACIDVEN 1.2    Estimated Creatinine Clearance: 88.4 mL/min (by C-G formula based on SCr of 0.66 mg/dL).    No Known Allergies  Antimicrobials this admission: Ceftriaxone 6/14 >>  Vancomycin 6/14 >>   Dose adjustments this admission: n/a  Microbiology results:  Raed Schalk Rodriguez-Guzman PharmD, BCPS 06/02/2024 6:06 PM

## 2024-06-02 NOTE — Progress Notes (Signed)
 Patient arrives to 1A room 160. He is alert and oriented x4 and ambulated from stretcher to bed. Vital signs taken, see doc flow. MD at bedside. Call bell in reach, bed in low position.

## 2024-06-02 NOTE — H&P (Signed)
 History and Physical   Curtis Sullivan ZOX:[id number] DOB: 06/29/1946 DOA: 06/02/2024  PCP: Pcp, No  Patient coming from: Home  I have personally briefly reviewed patient's old medical records in [facility] EMR.  Chief Concern: Right leg swelling  HPI: Curtis Sullivan is a 78 year old male with hypertension, hyperlipidemia, presents emergency department for chief concerns of fever and right lower extremity wound.  Vitals in the ED showed temperature of 98, respiration rate 20, heart rate 72, blood pressure 124/90, SpO2 97% on room air.  Serum sodium is 139, potassium 3.9, chloride 105, bicarb 25, BUN of 17, serum creatinine 0.66, EGFR greater than 60, nonfasting glucose 101, WBC 7.9, hemoglobin 14.8, platelets of 282.  Lactic acid 1.2.  ED treatment: Ceftriaxone 2 g IV one-time dose. ----------------------------------------- At bedside, patient was able to tell me his first and last name, age, location, current calendar year.  He reports he developed a wound on his right lower leg in April he does not know exactly when.  He has completed 2 separate rounds of antibiotics in the last time was 10 days ago.  He reports that the wound has worsened and did not improve therefore he presented to the emergency department for further evaluation.  Social history: He lives on his own.  He denies tobacco, EtOH, recreational drug use.  ROS: Constitutional: no weight change, + fever ENT/Mouth: no sore throat, no rhinorrhea Eyes: no eye pain, no vision changes Cardiovascular: no chest pain, no dyspnea,  no edema, no palpitations Respiratory: no cough, no sputum, no wheezing Gastrointestinal: no nausea, no vomiting, no diarrhea, no constipation Genitourinary: no urinary incontinence, no dysuria, no hematuria Musculoskeletal: no arthralgias, no myalgias Skin: + right lower extremity redness with warmth Neuro: + weakness, no loss of consciousness, no syncope Psych: no anxiety, no  depression, + decrease appetite Heme/Lymph: no bruising, no bleeding  ED Course: Discussed with EDP, patient requiring hospitalization for chief concerns of cellulitis of the right lower extremity  Assessment/Plan  Principal Problem:   Cellulitis of right leg Active Problems:   Essential hypertension   Hyperlipidemia   Hypertensive urgency   Right leg swelling   Assessment and Plan:  * Cellulitis of right leg  Continue with ceftriaxone 2 g IV daily to complete a 7-day course Vancomycin per pharmacy Vitamin C, zinc resumed on admission  Right leg swelling Right extremity ultrasound to assess for DVT ordered on admission  Hypertensive urgency Manual blood pressure was 250/120 done at bedside Machine blood pressure show SBP of 286 Hydralazine 20 mg IV one-time dose  Hyperlipidemia Patient does not take his home statin medication  Essential hypertension Home amlodipine  5 mg daily resumed Hydralazine 5 mg IV every 6 hours.  For SBP greater 165, 5 days ordered  Hydralazine changed to 10 mg IV every 6 hours as needed for SBP greater than 165 One-time dose of hydralazine 20 mg IV one-time dose Vasotec 0.625 mg IV every 6 hours as needed for SBP greater than 170 not responsive to IV hydralazine, 2 doses ordered  Chart reviewed.   DVT prophylaxis: Heparin 5000 units subcutaneous every 8 hours Code Status: Full code Diet: Heart healthy Family Communication: A phone call was offered, patient declined Disposition Plan: Pending clinical course Consults called: None at this time Admission status: MedSurg, telemetry observation  Past Medical History:  Diagnosis Date   COPD (chronic obstructive pulmonary disease) (HCC)    Hypertension    Past Surgical History:  Procedure Laterality Date   CLAVICLE SURGERY  Social History:  reports that he quit smoking about 32 years ago. His smoking use included cigarettes. He started smoking about 47 years ago. He has a 15 pack-year  smoking history. He has never used smokeless tobacco. He reports that he does not currently use alcohol. He reports that he does not use drugs.  No Known Allergies Family History  Problem Relation Age of Onset   Hypertension Father    Family history: Family history reviewed and not pertinent.  Prior to Admission medications   Medication Sig Start Date End Date Taking? Authorizing Provider  albuterol (VENTOLIN HFA) 108 (90 Base) MCG/ACT inhaler Inhale 2 puffs into the lungs 2 (two) times daily as needed for wheezing or shortness of breath. 09/10/16   [provider]  amLODipine  (NORVASC ) 10 MG tablet Take 10 mg by mouth daily. 03/24/22   [provider]  ascorbic acid (VITAMIN C) 500 MG tablet Take 500 mg by mouth daily.    [provider]  chlorthalidone (HYGROTON) 25 MG tablet Take 25 mg by mouth daily. Patient not taking: Reported on 11/06/2022 03/24/22   [provider]  cyanocobalamin (VITAMIN B12) 1000 MCG tablet Take 1,000 mcg by mouth daily.    [provider]  ELDERBERRY PO Take by mouth.    [provider]  losartan (COZAAR) 100 MG tablet TAKE ONE TABLET BY MOUTH AT BEDTIME FOR BLOOD PRESSURE 04/01/22   [provider]  Multiple Vitamins-Minerals (PRESERVISION AREDS PO) Take 1 tablet by mouth daily.    [provider]  Omega-3 Fatty Acids (FISH OIL) 500 MG CAPS Take 1 capsule by mouth daily.    [provider]  rosuvastatin (CRESTOR) 20 MG tablet Take 10 mg by mouth daily. Patient not taking: Reported on 11/06/2022 03/24/22   [provider]  Tiotropium Bromide-Olodaterol 2.5-2.5 MCG/ACT AERS Inhale 2 each into the lungs daily. 04/06/22   [provider]  Turmeric (QC TUMERIC COMPLEX PO) Take by mouth.    [provider]  Zinc 50 MG TABS Take 1 tablet by mouth daily.    [provider]   Physical Exam: Vitals:   06/02/24 1452 06/02/24 1455 06/02/24 1805 06/02/24 1814  BP:   (!) 124/90  (!) 250/130  Pulse:  72 69   Resp:  20 19   Temp:  98 F (36.7 C) 97.6 F (36.4 C)   TempSrc:  Oral Oral   SpO2:  97% 100%   Weight: 93 kg     Height: 5' 9 (1.753 m)      Constitutional: appears age-appropriate frail, body odor present Eyes: PERRL, lids and conjunctivae normal ENMT: Mucous membranes are moist. Posterior pharynx clear of any exudate or lesions. Age-appropriate dentition. Hearing appropriate Neck: normal, supple, no masses, no thyromegaly Respiratory: clear to auscultation bilaterally, no wheezing, no crackles. Normal respiratory effort. No accessory muscle use.  Cardiovascular: Regular rate and rhythm, no murmurs / rubs / gallops. No extremity edema. 2+ pedal pulses. No carotid bruits.  Abdomen: Obese abdomen, no tenderness, no masses palpated, no hepatosplenomegaly. Bowel sounds positive.  Musculoskeletal: no clubbing / cyanosis. No joint deformity upper and lower extremities. Good ROM, no contractures, no atrophy. Normal muscle tone.  Skin:  No induration Neurologic: Sensation intact. Strength 5/5 in all 4.  Psychiatric: Normal judgment and insight. Alert and oriented x 3. Normal mood.   EKG: Not indicated at this time  Chest x-ray on Admission: Not indicated at this time  Labs on Admission: I have personally reviewed  following labs CBC: Recent Labs  Lab 06/02/24 1456  WBC 7.9  NEUTROABS 4.6  HGB 14.8  HCT 44.2  MCV 95.3  PLT 282   Basic Metabolic Panel: Recent Labs  Lab 06/02/24 1456  NA 139  K 3.9  CL 105  CO2 25  GLUCOSE 101*  BUN 17  CREATININE 0.66  CALCIUM 8.8*   GFR: Estimated Creatinine Clearance: 88.4 mL/min (by C-G formula based on SCr of 0.66 mg/dL).  Liver Function Tests: Recent Labs  Lab 06/02/24 1456  AST 19  ALT 10  ALKPHOS 65  BILITOT 0.6  PROT 6.7  ALBUMIN 3.8   This document was prepared using Dragon Voice Recognition software and may include unintentional dictation errors.  Dr. Reinhold Carbine [facility]  If 7PM-7AM, please contact overnight-coverage provider If 7AM-7PM, please contact day attending provider [email or web address]  06/02/2024, 6:21 PM

## 2024-06-02 NOTE — Assessment & Plan Note (Addendum)
  Continue with ceftriaxone 2 g IV daily to complete a 7-day course Vancomycin per pharmacy Vitamin C, zinc resumed on admission

## 2024-06-02 NOTE — Assessment & Plan Note (Signed)
 Manual blood pressure was 250/120 done at bedside Machine blood pressure show SBP of 286 Hydralazine 20 mg IV one-time dose

## 2024-06-02 NOTE — Hospital Course (Signed)
 Mr. Curtis Sullivan is a 78 year old male with hypertension, hyperlipidemia, presents emergency department for chief concerns of fever and right lower extremity wound.  Vitals in the ED showed temperature of 98, respiration rate 20, heart rate 72, blood pressure 124/90, SpO2 97% on room air.  Serum sodium is 139, potassium 3.9, chloride 105, bicarb 25, BUN of 17, serum creatinine 0.66, EGFR greater than 60, nonfasting glucose 101, WBC 7.9, hemoglobin 14.8, platelets of 282.  Lactic acid 1.2.  ED treatment: Ceftriaxone 2 g IV one-time dose.

## 2024-06-02 NOTE — Assessment & Plan Note (Addendum)
 Home amlodipine  5 mg daily resumed Hydralazine 5 mg IV every 6 hours.  For SBP greater 165, 5 days ordered  Hydralazine changed to 10 mg IV every 6 hours as needed for SBP greater than 165 One-time dose of hydralazine 20 mg IV one-time dose Vasotec 0.625 mg IV every 6 hours as needed for SBP greater than 170 not responsive to IV hydralazine, 2 doses ordered

## 2024-06-02 NOTE — Assessment & Plan Note (Signed)
 Right extremity ultrasound to assess for DVT ordered on admission

## 2024-06-03 ENCOUNTER — Observation Stay

## 2024-06-03 DIAGNOSIS — Z7951 Long term (current) use of inhaled steroids: Secondary | ICD-10-CM | POA: Diagnosis not present

## 2024-06-03 DIAGNOSIS — R0600 Dyspnea, unspecified: Secondary | ICD-10-CM | POA: Diagnosis not present

## 2024-06-03 DIAGNOSIS — L03115 Cellulitis of right lower limb: Secondary | ICD-10-CM | POA: Diagnosis present

## 2024-06-03 DIAGNOSIS — I160 Hypertensive urgency: Secondary | ICD-10-CM | POA: Diagnosis present

## 2024-06-03 DIAGNOSIS — R54 Age-related physical debility: Secondary | ICD-10-CM | POA: Diagnosis present

## 2024-06-03 DIAGNOSIS — Z8249 Family history of ischemic heart disease and other diseases of the circulatory system: Secondary | ICD-10-CM | POA: Diagnosis not present

## 2024-06-03 DIAGNOSIS — Z87891 Personal history of nicotine dependence: Secondary | ICD-10-CM | POA: Diagnosis not present

## 2024-06-03 DIAGNOSIS — Z79899 Other long term (current) drug therapy: Secondary | ICD-10-CM | POA: Diagnosis not present

## 2024-06-03 DIAGNOSIS — E785 Hyperlipidemia, unspecified: Secondary | ICD-10-CM | POA: Diagnosis present

## 2024-06-03 DIAGNOSIS — J449 Chronic obstructive pulmonary disease, unspecified: Secondary | ICD-10-CM | POA: Diagnosis present

## 2024-06-03 DIAGNOSIS — I10 Essential (primary) hypertension: Secondary | ICD-10-CM | POA: Diagnosis present

## 2024-06-03 LAB — CBC
HCT: 44.6 % (ref 39.0–52.0)
Hemoglobin: 15.3 g/dL (ref 13.0–17.0)
MCH: 32 pg (ref 26.0–34.0)
MCHC: 34.3 g/dL (ref 30.0–36.0)
MCV: 93.3 fL (ref 80.0–100.0)
Platelets: 296 10*3/uL (ref 150–400)
RBC: 4.78 MIL/uL (ref 4.22–5.81)
RDW: 12.9 % (ref 11.5–15.5)
WBC: 9.5 10*3/uL (ref 4.0–10.5)
nRBC: 0 % (ref 0.0–0.2)

## 2024-06-03 LAB — BASIC METABOLIC PANEL WITH GFR
Anion gap: 7 (ref 5–15)
BUN: 13 mg/dL (ref 8–23)
CO2: 25 mmol/L (ref 22–32)
Calcium: 8.7 mg/dL — ABNORMAL LOW (ref 8.9–10.3)
Chloride: 108 mmol/L (ref 98–111)
Creatinine, Ser: 0.59 mg/dL — ABNORMAL LOW (ref 0.61–1.24)
GFR, Estimated: >60 mL/min (ref >60)
Glucose, Bld: 100 mg/dL — ABNORMAL HIGH (ref 70–99)
Potassium: 3.8 mmol/L (ref 3.5–5.1)
Sodium: 140 mmol/L (ref 135–145)

## 2024-06-03 MED ORDER — VANCOMYCIN HCL 1500 MG/300ML IV SOLN
1500.0000 mg | INTRAVENOUS | Status: DC
  Filled 2024-06-03: qty 300

## 2024-06-03 MED ORDER — VANCOMYCIN HCL 2000 MG/400ML IV SOLN
2000.0000 mg | Freq: Once | INTRAVENOUS | Status: AC
  Administered 2024-06-03: 2000 mg via INTRAVENOUS
  Filled 2024-06-03: qty 400

## 2024-06-03 MED ORDER — UMECLIDINIUM BROMIDE 62.5 MCG/ACT IN AEPB
1.0000 | INHALATION_SPRAY | Freq: Every day | RESPIRATORY_TRACT | Status: DC
  Administered 2024-06-03 – 2024-06-05 (×3): 1 via RESPIRATORY_TRACT
  Filled 2024-06-03: qty 7

## 2024-06-03 MED ORDER — ARFORMOTEROL TARTRATE 15 MCG/2ML IN NEBU
15.0000 ug | INHALATION_SOLUTION | Freq: Two times a day (BID) | RESPIRATORY_TRACT | Status: DC
  Administered 2024-06-03 (×2): 15 ug via RESPIRATORY_TRACT
  Filled 2024-06-03 (×6): qty 2

## 2024-06-03 NOTE — Progress Notes (Signed)
  Progress Note   Patient: Curtis Sullivan [id number]:[id number] DOB: 1946-10-28 DOA: 06/02/2024     0 DOS: the patient was seen and examined on 06/03/2024   Brief hospital course:  Mr. Curtis Sullivan is a 78 year old male with hypertension, hyperlipidemia, presents emergency department for chief concerns of fever and right lower extremity wound. ... He reports he developed a wound on his right lower leg in April he does not know exactly when. He has completed 2 separate rounds of antibiotics in the last time was 10 days ago. He reports that the wound has worsened and did not improve therefore he presented to the emergency department for further evaluation.   Patient was admitted and started on IV antibiotics for right lower extremity cellulitis, having failed multiple courses of oral antibiotic.    Assessment and Plan:  Right lower extremity cellulitis --Continue Vanc, Rocephin --Monitor fever curve, CBC, and clinically for improvement  Dyspnea - CXR to rule out pulmonary edema given severely elevated BP's on admission   Right leg swelling - due to cellulitis --Negative venous U/S for DVT    Hypertensive urgency - initial BP charted at 250/130. Pt reports due to stress. Essential Hypertension --IV hydralazine PRN --Continue home amlodipine , enalapril   Hyperlipidemia --not on statin         Subjective: Pt awake resting in bed this AM. Reports doing okay, no fever/chills.  Leg redness showing slight improvement.  No other complaints.. Pt feels his BP was so high due to stress from the situation and having to be in hospital.     Physical Exam: Vitals:   06/03/24 0100 06/03/24 0200 06/03/24 0400 06/03/24 0904  BP: (!) 160/70  (!) 142/80 (!) 185/82  Pulse:   78 68  Resp:    18  Temp:   98.4 F (36.9 C) 97.6 F (36.4 C)  TempSrc:   Oral   SpO2:  96% 97% 99%  Weight:      Height:       General exam: awake, alert, no acute distress HEENT: poor dentition, moist mucus  membranes, hearing grossly normal  Respiratory system: CTAB generally diminished breath sounds, no wheezes, rales or rhonchi, mildly normal respiratory effort with accessory muscle use. Cardiovascular system: normal S1/S2, RRR,  Gastrointestinal system: soft, NT, ND Central nervous system: A&O x 3. no gross focal neurologic deficits, normal speech Extremities: RLE erythema and warmth on palpation, lateral right lower leg ulceration without drainage Psychiatry: normal mood, congruent affect, judgement and insight appear normal   Data Reviewed:  Notable labs -- Cr .59, Ca 8.7 Normal CBC  Family Communication: None. Pt updated in detail and capable to update.  Disposition: Status is: Observation Remains admitted for further IV antibiotics pending further clinical improvement   Planned Discharge Destination: Home    Time spent: 45 minutes  Author: Montey Apa, DO 06/03/2024 4:31 PM  For on call review [email or web address].

## 2024-06-03 NOTE — Plan of Care (Signed)
  Problem: Clinical Measurements: Goal: Ability to maintain clinical measurements within normal limits will improve Outcome: Progressing   Problem: Activity: Goal: Risk for activity intolerance will decrease Outcome: Progressing   Problem: Pain Managment: Goal: General experience of comfort will improve and/or be controlled Outcome: Progressing   Problem: Safety: Goal: Ability to remain free from injury will improve Outcome: Progressing   Problem: Skin Integrity: Goal: Risk for impaired skin integrity will decrease Outcome: Progressing

## 2024-06-03 NOTE — Care Management Obs Status (Signed)
 MEDICARE OBSERVATION STATUS NOTIFICATION   Patient Details  Name: Cipriano Millikan MRN: [id number] Date of Birth: 03/17/1946   Medicare Observation Status Notification Given:  Yes    Marino Sias, RN 06/03/2024, 11:52 AM

## 2024-06-04 DIAGNOSIS — L03115 Cellulitis of right lower limb: Secondary | ICD-10-CM | POA: Diagnosis not present

## 2024-06-04 LAB — CREATININE, SERUM
Creatinine, Ser: 0.64 mg/dL (ref 0.61–1.24)
GFR, Estimated: >60 mL/min (ref >60)

## 2024-06-04 MED ORDER — LOSARTAN POTASSIUM 50 MG PO TABS
50.0000 mg | ORAL_TABLET | Freq: Every day | ORAL | Status: DC
  Administered 2024-06-04 – 2024-06-05 (×2): 50 mg via ORAL
  Filled 2024-06-04 (×2): qty 1

## 2024-06-04 MED ORDER — DIPHENHYDRAMINE-ZINC ACETATE 2-0.1 % EX CREA
TOPICAL_CREAM | Freq: Two times a day (BID) | CUTANEOUS | Status: DC | PRN
  Filled 2024-06-04: qty 1

## 2024-06-04 MED ORDER — CHLORTHALIDONE 25 MG PO TABS
25.0000 mg | ORAL_TABLET | Freq: Every day | ORAL | Status: DC
  Administered 2024-06-04 – 2024-06-05 (×2): 25 mg via ORAL
  Filled 2024-06-04 (×2): qty 1

## 2024-06-04 MED ORDER — VANCOMYCIN HCL 1250 MG/250ML IV SOLN
1250.0000 mg | Freq: Two times a day (BID) | INTRAVENOUS | Status: DC
  Administered 2024-06-04 (×2): 1250 mg via INTRAVENOUS
  Filled 2024-06-04 (×3): qty 250

## 2024-06-04 NOTE — Progress Notes (Signed)
   06/04/24 1815  Assess: MEWS Score  Temp 98.4 F (36.9 C)  BP (!) 202/80  MAP (mmHg) 120  Pulse Rate 77  Resp 18  SpO2 98 %  O2 Device Room Air  Assess: MEWS Score  MEWS Temp 0  MEWS Systolic 2  MEWS Pulse 0  MEWS RR 0  MEWS LOC 0  MEWS Score 2  MEWS Score Color Yellow  Assess: if the MEWS score is Yellow or Red  Were vital signs accurate and taken at a resting state? Yes  Does the patient meet 2 or more of the SIRS criteria? No  MEWS guidelines implemented  Yes, yellow  Treat  MEWS Interventions Considered administering scheduled or prn medications/treatments as ordered  Take Vital Signs  Increase Vital Sign Frequency  Yellow: Q2hr x1, continue Q4hrs until patient remains green for 12hrs  Escalate  MEWS: Escalate Yellow: Discuss with charge nurse and consider notifying provider and/or RRT  Provider Notification  Provider Name/Title Dr. Antoniette Batty  Date Provider Notified 06/04/24  Time Provider Notified 1820  Method of Notification Page  Notification Reason Other (Comment) (Yellow MEWS)  Provider response No new orders  Assess: SIRS CRITERIA  SIRS Temperature  0  SIRS Respirations  0  SIRS Pulse 0  SIRS WBC 0  SIRS Score Sum  0

## 2024-06-04 NOTE — Consult Note (Signed)
 Pharmacy Antibiotic Note  Curtis Sullivan is a 78 y.o. male w/ PMH of HTN, HLD admitted on 06/02/2024 with cellulitis.  Pharmacy has been consulted for vancomycin dosing.  Plan: ceftriaxone ordered by MD adjust vancomycin to 1250 mg IV every 12 hours Goal AUC 400-550. Expected AUC: 536.4 SCr used: 0.8 (0.66) Expected Cmin: 15.9  Height: 5' 9 (175.3 cm) Weight: 93 kg (205 lb) IBW/kg (Calculated) : 70.7  Temp (24hrs), Avg:98 F (36.7 C), Min:97.6 F (36.4 C), Max:98.3 F (36.8 C)  Recent Labs  Lab 06/02/24 1456 06/03/24 0433 06/04/24 0101  WBC 7.9 9.5  --   CREATININE 0.66 0.59* 0.64  LATICACIDVEN 1.2  --   --     Estimated Creatinine Clearance: 88.4 mL/min (by C-G formula based on SCr of 0.64 mg/dL).    No Known Allergies  Antimicrobials this admission: ceftriaxone 6/14 >>  vancomycin 6/14 >>   Microbiology results:  Barney Boozer, PharmD, BCPS 06/04/2024 8:22 AM

## 2024-06-04 NOTE — Progress Notes (Signed)
 Patient's blood pressure has been elevated; primary RN explained to the patient the need for PRN hydralazine; patient refused; last blood pressure was 202/80.

## 2024-06-04 NOTE — Progress Notes (Signed)
  Progress Note   Patient: Curtis Sullivan WUJ:[id number] DOB: [birth year] DOA: 06/02/2024     1 DOS: the patient was seen and examined on 06/04/2024   Brief hospital course:  Mr. Curtis Sullivan is a 78 year old male with hypertension, hyperlipidemia, presents emergency department for chief concerns of fever and right lower extremity wound. ... He reports he developed a wound on his right lower leg in April he does not know exactly when. He has completed 2 separate rounds of antibiotics in the last time was 10 days ago. He reports that the wound has worsened and did not improve therefore he presented to the emergency department for further evaluation.   Patient was admitted and started on IV antibiotics for right lower extremity cellulitis, having failed multiple courses of oral antibiotic.    Assessment and Plan:  Right lower extremity cellulitis --Continue Vanc, Rocephin --Monitor fever curve, CBC, and clinically for improvement  Dyspnea - CXR negative.  Was concerned for hypertensive heart failure --Monitor   Right leg swelling - due to cellulitis --Negative venous U/S for DVT    Hypertensive urgency - initial BP charted at 250/130. Pt reports due to stress. Essential Hypertension --IV hydralazine or Vasotec PRN --D/C amlodipine  - pt reports not taking, causes swelling --Start chlorthalidone - pt reports is only med he takes --Added losartan    Hyperlipidemia --not on statin  COPD - stable --Brovana and Incruse  --PRN albuterol         Subjective: Pt awake resting in bed this AM.  He is frustrated by changes in BP medications and refused amlodipine .  Pt states I came here for my leg and then talks about how he's had high BP for 15 years and it always goes up when stressed.  He asks to go home today.  Discussed needing further improvement in cellulitis on IV antibiotics, but that it is his choice to stay.  He said he would think about, but might sign out AMA.  No  acute events.   Physical Exam: Vitals:   06/04/24 0030 06/04/24 0355 06/04/24 0700 06/04/24 0809  BP: (!) 160/64 (!) 166/76 (!) 171/74 (!) 160/86  Pulse: 72 82 67 73  Resp:  18 18   Temp:  98.3 F (36.8 C) 98.1 F (36.7 C)   TempSrc:      SpO2:  95% 94%   Weight:      Height:       General exam: awake, alert, no acute distress HEENT: poor dentition, moist mucus membranes, hearing grossly normal  Respiratory system: on room air, normal respiratory effort at rest Cardiovascular system: normal S1/S2, RRR Central nervous system: A&O x 3. no gross focal neurologic deficits, normal speech Extremities: RLE erythema and warmth on palpation, lateral right lower leg ulceration without drainage Psychiatry: normal mood, congruent affect, judgement and insight appear normal   Data Reviewed:  Normal renal function. No other new labs.   Family Communication: None. Pt updated in detail and capable to update.  Disposition: Status is: Observation Remains admitted for further IV antibiotics pending further clinical improvement   Planned Discharge Destination: Home    Time spent: 4425 minutes  Author: Montey Apa, DO 06/04/2024 1:16 PM  For on call review [email or web address].

## 2024-06-04 NOTE — Plan of Care (Signed)

## 2024-06-05 ENCOUNTER — Other Ambulatory Visit: Payer: Self-pay

## 2024-06-05 DIAGNOSIS — L03115 Cellulitis of right lower limb: Secondary | ICD-10-CM | POA: Diagnosis not present

## 2024-06-05 MED ORDER — DOXYCYCLINE HYCLATE 100 MG PO TABS
100.0000 mg | ORAL_TABLET | Freq: Two times a day (BID) | ORAL | 0 refills | Status: AC
  Filled 2024-06-05: qty 14, 7d supply, fill #0

## 2024-06-05 MED ORDER — CEPHALEXIN 500 MG PO CAPS
500.0000 mg | ORAL_CAPSULE | Freq: Four times a day (QID) | ORAL | 0 refills | Status: AC
  Filled 2024-06-05: qty 28, 7d supply, fill #0

## 2024-06-05 NOTE — Plan of Care (Signed)
  Problem: Education: Goal: Knowledge of General Education information will improve Description: Including pain rating scale, medication(s)/side effects and non-pharmacologic comfort measures Outcome: Progressing   Problem: Clinical Measurements: Goal: Will remain free from infection Outcome: Progressing   Problem: Safety: Goal: Ability to remain free from injury will improve Outcome: Progressing   Problem: Clinical Measurements: Goal: Ability to avoid or minimize complications of infection will improve Outcome: Progressing   Problem: Skin Integrity: Goal: Skin integrity will improve Outcome: Progressing

## 2024-06-05 NOTE — Discharge Summary (Signed)
 Physician Discharge Summary   Patient: Curtis Sullivan MRN: [id number] DOB: 08-31-1946  Admit date:     06/02/2024  Discharge date: 06/05/24  Discharge Physician: Montey Apa   PCP: Pcp, No   Recommendations at discharge:   Follow up with Primary Care in 1-2 weeks Repeat CBC, BMP at follow up Recommend follow up on blood pressure control and education.   Pt's BP as high as systolic 200's during admission and he refused PRN medications to lower BP to safe levels Follow up on resolution of right leg cellulitis  Discharge Diagnoses: Principal Problem:   Cellulitis of right leg Active Problems:   Essential hypertension   Hyperlipidemia   Hypertensive urgency   Right leg swelling   Cellulitis of right lower extremity  Resolved Problems:   * No resolved hospital problems. [facility] Course:  Curtis Sullivan is a 78 year old male with hypertension, hyperlipidemia, presents emergency department for chief concerns of fever and right lower extremity wound. ... He reports he developed a wound on his right lower leg in April he does not know exactly when. He has completed 2 separate rounds of antibiotics in the last time was 10 days ago. He reports that the wound has worsened and did not improve therefore he presented to the emergency department for further evaluation.    Patient was admitted and started on IV antibiotics for right lower extremity cellulitis, having failed multiple courses of oral antibiotic.  6/17 - RLE cellulitis has significantly improved.   Pt medically stable for and requesting discharge.    Assessment and Plan:  Right lower extremity cellulitis --Continue Vanc, Rocephin --Monitor fever curve, CBC, and clinically for improvement   Dyspnea - CXR negative.  Was concerned for hypertensive heart failure --Monitor   Right leg swelling - due to cellulitis --Negative venous U/S for DVT    Hypertensive urgency - initial BP charted at 250/130. Pt  reports due to stress. Essential Hypertension --IV hydralazine or Vasotec PRN -- pt refused PRN meds when they were indicated for severely elevated BP's throughout admission --D/C amlodipine  - pt reports not taking, causes swelling --Started chlorthalidone - pt reports is only med he takes --Added losartan - continue --PCP follow up in 1-2 weeks     Hyperlipidemia --not on statin   COPD - stable --Brovana and Incruse  --PRN albuterol       Consultants: None Procedures performed: None  Disposition: Home Diet recommendation:  Cardiac diet  DISCHARGE MEDICATION: Allergies as of 06/05/2024   No Known Allergies      Medication List     TAKE these medications    albuterol 108 (90 Base) MCG/ACT inhaler Commonly known as: VENTOLIN HFA Inhale 2 puffs into the lungs 2 (two) times daily as needed for wheezing or shortness of breath.   ascorbic acid 500 MG tablet Commonly known as: VITAMIN C Take 500 mg by mouth daily.   cephALEXin 500 MG capsule Commonly known as: KEFLEX Take 1 capsule (500 mg total) by mouth 4 (four) times daily for 7 days.   chlorthalidone 25 MG tablet Commonly known as: HYGROTON Take 25 mg by mouth daily.   cyanocobalamin 1000 MCG tablet Commonly known as: VITAMIN B12 Take 1,000 mcg by mouth daily.   doxycycline 100 MG tablet Commonly known as: VIBRA-TABS Take 1 tablet (100 mg total) by mouth 2 (two) times daily for 7 days.   ELDERBERRY PO Take by mouth.   Fish Oil 500 MG Caps Take 1 capsule by  mouth daily.   losartan 100 MG tablet Commonly known as: COZAAR TAKE ONE TABLET BY MOUTH AT BEDTIME FOR BLOOD PRESSURE   QC TUMERIC COMPLEX PO Take by mouth.   rosuvastatin 20 MG tablet Commonly known as: CRESTOR Take 10 mg by mouth daily.   Tiotropium Bromide-Olodaterol 2.5-2.5 MCG/ACT Aers Inhale 2 each into the lungs daily.   Zinc 50 MG Tabs Take 1 tablet by mouth daily.        Discharge Exam: Filed Weights   06/02/24 1452   Weight: 93 kg   General exam: awake, alert, no acute distress HEENT: poor dentition, moist mucus membranes, hearing grossly normal  Respiratory system: on room air, normal respiratory effort. Cardiovascular system: RRR, no murmurs Gastrointestinal system: soft, NT, ND Central nervous system: A&O x3. no gross focal neurologic deficits, normal speech Extremities: RLE erythema improving Psychiatry: normal mood, congruent affect   Condition at discharge: stable  The results of significant diagnostics from this hospitalization (including imaging, microbiology, ancillary and laboratory) are listed below for reference.   Imaging Studies: DG Chest Port 1 View Result Date: 06/03/2024 CLINICAL DATA:  [id number] Dyspnea [id number] EXAM: PORTABLE CHEST 1 VIEW COMPARISON:  November 06, 2022 FINDINGS: The cardiomediastinal silhouette is unchanged in contour.Rounded lucency along the inferior LEFT paramediastinal border may reflect a hiatal hernia versus prominent stomach gas. Atherosclerotic calcifications. No pleural effusion. No pneumothorax. No acute pleuroparenchymal abnormality. IMPRESSION: 1. No acute cardiopulmonary abnormality. 2. Rounded lucency along the inferior LEFT paramediastinal border may reflect a hiatal hernia versus prominent stomach gas. Electronically Signed   By: Clancy Crimes M.D.   On: 06/03/2024 13:21   US  Venous Img Lower Unilateral Right (DVT) Result Date: 06/03/2024 CLINICAL DATA:  Right lower extremity swelling EXAM: RIGHT LOWER EXTREMITY VENOUS DOPPLER ULTRASOUND TECHNIQUE: Gray-scale sonography with compression, as well as color and duplex ultrasound, were performed to evaluate the deep venous system(s) from the level of the common femoral vein through the popliteal and proximal calf veins. COMPARISON:  None Available. FINDINGS: VENOUS Normal compressibility of the common femoral, superficial femoral, and popliteal veins, as well as the visualized calf veins. Visualized portions  of profunda femoral vein and great saphenous vein unremarkable. No filling defects to suggest DVT on grayscale or color Doppler imaging. Doppler waveforms show normal direction of venous flow, normal respiratory plasticity and response to augmentation. Limited views of the contralateral common femoral vein are unremarkable. OTHER None. Limitations: none IMPRESSION: Negative. Electronically Signed   By: Fernando Hoyer M.D.   On: 06/03/2024 08:26    Microbiology: Results for orders placed or performed during the hospital encounter of 11/06/22  Resp Panel by RT-PCR (Flu A&B, Covid) Anterior Nasal Swab     Status: Abnormal   Collection Time: 11/06/22  6:23 PM   Specimen: Anterior Nasal Swab  Result Value Ref Range Status   SARS Coronavirus 2 by RT PCR POSITIVE (A) NEGATIVE Final    Comment: (NOTE) SARS-CoV-2 target nucleic acids are DETECTED.  The SARS-CoV-2 RNA is generally detectable in upper respiratory specimens during the acute phase of infection. Positive results are indicative of the presence of the identified virus, but do not rule out bacterial infection or co-infection with other pathogens not detected by the test. Clinical correlation with patient history and other diagnostic information is necessary to determine patient infection status. The expected result is Negative.  Fact Sheet for Patients: BloggerCourse.com  Fact Sheet for Healthcare Providers: SeriousBroker.it  This test is not yet approved or cleared by the [place]   FDA and  has been authorized for detection and/or diagnosis of SARS-CoV-2 by FDA under an Emergency Use Authorization (EUA).  This EUA will remain in effect (meaning this test can be used) for the duration of  the COVID-19 declaration under Section 564(b)(1) of the A ct, 21 U.S.C. section 360bbb-3(b)(1), unless the authorization is terminated or revoked sooner.     Influenza A by PCR NEGATIVE  NEGATIVE Final   Influenza B by PCR NEGATIVE NEGATIVE Final    Comment: (NOTE) The Xpert Xpress SARS-CoV-2/FLU/RSV plus assay is intended as an aid in the diagnosis of influenza from Nasopharyngeal swab specimens and should not be used as a sole basis for treatment. Nasal washings and aspirates are unacceptable for Xpert Xpress SARS-CoV-2/FLU/RSV testing.  Fact Sheet for Patients: BloggerCourse.com  Fact Sheet for Healthcare Providers: SeriousBroker.it  This test is not yet approved or cleared by the [place]  FDA and has been authorized for detection and/or diagnosis of SARS-CoV-2 by FDA under an Emergency Use Authorization (EUA). This EUA will remain in effect (meaning this test can be used) for the duration of the COVID-19 declaration under Section 564(b)(1) of the Act, 21 U.S.C. section 360bbb-3(b)(1), unless the authorization is terminated or revoked.  Performed at [facility], [address] [place], [place] [address]     Labs: CBC: Recent Labs  Lab 06/02/24 1456 06/03/24 0433  WBC 7.9 9.5  NEUTROABS 4.6  --   HGB 14.8 15.3  HCT 44.2 44.6  MCV 95.3 93.3  PLT 282 296   Basic Metabolic Panel: Recent Labs  Lab 06/02/24 1456 06/03/24 0433 06/04/24 0101  NA 139 140  --   K 3.9 3.8  --   CL 105 108  --   CO2 25 25  --   GLUCOSE 101* 100*  --   BUN 17 13  --   CREATININE 0.66 0.59* 0.64  CALCIUM 8.8* 8.7*  --    Liver Function Tests: Recent Labs  Lab 06/02/24 1456  AST 19  ALT 10  ALKPHOS 65  BILITOT 0.6  PROT 6.7  ALBUMIN 3.8   CBG: No results for input(s): GLUCAP in the last 168 hours.  Discharge time spent: less than 30 minutes.  Signed: Montey Apa, DO [facility] 06/05/2024

## 2024-06-05 NOTE — Progress Notes (Signed)
 Patient's blood pressure has remained elevated for this shift. Patient continues to refuse prn blood pressure medications stating my blood pressure has been high for many years, and it gets worse whenever I visit the doctor/hospital. Education reinforced, verbalized understanding. See last BP reading below.        06/05/24 0428  Vitals  Temp 97.6 F (36.4 C)  BP (!) 193/82  MAP (mmHg) 114  BP Location Right Arm  BP Method Automatic  Patient Position (if appropriate) Lying  Pulse Rate (!) 59  Resp 18  MEWS COLOR  MEWS Score Color Green  Oxygen  Therapy  SpO2 96 %  O2 Device Room Air  MEWS Score  MEWS Temp 0  MEWS Systolic 0  MEWS Pulse 0  MEWS RR 0  MEWS LOC 0  MEWS Score 0

## 2024-06-18 ENCOUNTER — Other Ambulatory Visit: Payer: Self-pay

## 2024-07-16 ENCOUNTER — Encounter: Payer: Self-pay | Admitting: Infectious Diseases

## 2024-07-16 ENCOUNTER — Other Ambulatory Visit: Payer: Self-pay

## 2024-07-16 ENCOUNTER — Ambulatory Visit: Admitting: Infectious Diseases

## 2024-07-16 VITALS — BP 145/81 | HR 80 | Temp 98.2°F | Wt 203.0 lb

## 2024-07-16 DIAGNOSIS — L03115 Cellulitis of right lower limb: Secondary | ICD-10-CM | POA: Diagnosis not present

## 2024-07-16 DIAGNOSIS — S81801A Unspecified open wound, right lower leg, initial encounter: Secondary | ICD-10-CM | POA: Diagnosis not present

## 2024-07-16 DIAGNOSIS — Z79899 Other long term (current) drug therapy: Secondary | ICD-10-CM | POA: Diagnosis not present

## 2024-07-16 NOTE — Progress Notes (Unsigned)
 Patient Active Problem List   Diagnosis Date Noted   Cellulitis of right lower extremity 06/03/2024   Cellulitis of right leg 06/02/2024   Essential hypertension 06/02/2024   Hyperlipidemia 06/02/2024   Hypertensive urgency 06/02/2024   Right leg swelling 06/02/2024    Patient's Medications  New Prescriptions   No medications on file  Previous Medications   ALBUTEROL  (VENTOLIN  HFA) 108 (90 BASE) MCG/ACT INHALER    Inhale 2 puffs into the lungs 2 (two) times daily as needed for wheezing or shortness of breath.   ASCORBIC ACID  (VITAMIN C ) 500 MG TABLET    Take 500 mg by mouth daily.   CHLORTHALIDONE  (HYGROTON ) 25 MG TABLET    Take 25 mg by mouth daily.   CYANOCOBALAMIN  (VITAMIN B12) 1000 MCG TABLET    Take 1,000 mcg by mouth daily.   ELDERBERRY PO    Take by mouth.   LOSARTAN  (COZAAR ) 100 MG TABLET    TAKE ONE TABLET BY MOUTH AT BEDTIME FOR BLOOD PRESSURE   OMEGA-3 FATTY ACIDS (FISH OIL) 500 MG CAPS    Take 1 capsule by mouth daily.   ROSUVASTATIN (CRESTOR) 20 MG TABLET    Take 10 mg by mouth daily.   TIOTROPIUM BROMIDE-OLODATEROL 2.5-2.5 MCG/ACT AERS    Inhale 2 each into the lungs daily.   TURMERIC (QC TUMERIC COMPLEX PO)    Take by mouth.   ZINC  50 MG TABS    Take 1 tablet by mouth daily.  Modified Medications   No medications on file  Discontinued Medications   No medications on file    Subjective: Discussed the use of AI scribe software for clinical note transcription with the patient, who gave verbal consent to proceed.   78 Y O male with prior h/o HTN, HLD, COPD who is referred for HFU after recent hospital admission 6/14-6/17 for Rt leg cellulitis.   He reports sustaining injury to his rt lower leg during [place] three [place] ago in April. The wound was not initially noticed due to a concurrent back injury and was not dressed for about 48 hours, likely leading to the infection.  He first sought medical attention at the [facility]  when the wound became infected and was prescribed doxycycline  for seven days. After a week-long trip to the [place], he returned to the [place] as the infection persisted. He was given another course of antibiotics ( doxycycline  and cephalexin  for 7 days) , which he took for a week, but it did not seem effective. There was later concerns for developing sepsis and, he went to [facility] and was admitted in mid June where he received intravenous antibiotics with Vancomycin  and ceftriaxone . US  was negative for DVT. He also had some issues with management of HTN while hospitalized. He was discharged on 6/17 with cephalexin  for 7 days.   Reports he has completed another round of 2 weeks course of  cephalexin  250mg  po qid and bactrim bid prescribed on 7/7 by another provider after completing the course of cephalexin  prescribed during discharge, which she reports completed last Sunday and reports swelling and redness have subsided.  Hs initial BP was high in the clinic with systolic in 170s and says he had not taken his meds. He however explains he knows how to take care of BP, he focuses on healthy diet and exercise for maintaining his BP and that helps more than his medications. Denies having chest pain, sob, headache.  Discussed importance to be  compliant with his medications.   Review of Systems: all systems reviewed with pertinent positives and negatives as listed above   Past Medical History:  Diagnosis Date   COPD (chronic obstructive pulmonary disease) (HCC)    Hypertension    Past Surgical History:  Procedure Laterality Date   CLAVICLE SURGERY     Social History   Tobacco Use   Smoking status: Former    Current packs/day: 0.00    Average packs/day: 1 pack/day for 15.0 years (15.0 ttl pk-yrs)    Types: Cigarettes    Start date: 61    Quit date: 40    Years since quitting: 32.5   Smokeless tobacco: Never  Vaping Use   Vaping status: Never Used  Substance Use Topics   Alcohol use: Not Currently    Drug use: Never    Family History  Problem Relation Age of Onset   Hypertension Father     No Known Allergies  Health Maintenance  Topic Date Due   Medicare Annual Wellness (AWV)  Never done   Hepatitis C Screening  Never done   DTaP/Tdap/Td (1 - Tdap) Never done   Pneumococcal Vaccine: 50+ Years (1 of 1 - PCV) Never done   Zoster Vaccines- Shingrix (1 of 2) Never done   COVID-19 Vaccine (1 - 2024-25 season) Never done   INFLUENZA VACCINE  07/20/2024   Hepatitis B Vaccines  Aged Out   HPV VACCINES  Aged Out   Meningococcal B Vaccine  Aged Out    Objective: BP (!) 145/81   Pulse 80   Temp 98.2 F (36.8 C) (Oral)   Wt 203 lb (92.1 kg)   SpO2 96%   BMI 29.98 kg/m    Physical Exam Constitutional:      Appearance: Normal appearance.  HENT:     Head: Normocephalic and atraumatic.      Mouth: Mucous membranes are moist. Multiple missing teeth Eyes:    Conjunctiva/sclera: Conjunctivae normal.     Pupils: Pupils are equal, round, and b/l symmetrical    Cardiovascular:     Rate and Rhythm: Normal rate and regular rhythm.     Heart sounds: s1s2  Pulmonary:     Effort: Pulmonary effort is normal.     Breath sounds: Normal breath sounds.   Abdominal:     General: Non distended     Palpations: soft.   Musculoskeletal:        General: Normal range of motion.   Skin:    General: Skin is warm and dry.     Comments:  Picture of rt leg wound taken from patient's phone when initially started    Rt leg cellulitis resolved with no signs of active infection, prior traumatic wound in the lower leg has almost resolved.      Neurological:     General: grossly non focal     Mental Status: awake, alert and oriented to person, place, and time.   Psychiatric:        Mood and Affect: Mood normal.   Lab Results Lab Results  Component Value Date   WBC 9.5 06/03/2024   HGB 15.3 06/03/2024   HCT 44.6 06/03/2024   MCV 93.3 06/03/2024   PLT 296 06/03/2024    Lab  Results  Component Value Date   CREATININE 0.64 06/04/2024   BUN 13 06/03/2024   NA 140 06/03/2024   K 3.8 06/03/2024   CL 108 06/03/2024   CO2 25 06/03/2024    Lab Results  Component Value Date   ALT 10 06/02/2024   AST 19 06/02/2024   ALKPHOS 65 06/02/2024   BILITOT 0.6 06/02/2024    No results found for: CHOL, HDL, LDLCALC, LDLDIRECT, TRIG, CHOLHDL No results found for: LABRPR, RPRTITER No results found for: HIV1RNAQUANT, HIV1RNAVL, CD4TABS   Microbiology Results for orders placed or performed during the hospital encounter of 11/06/22  Resp Panel by RT-PCR (Flu A&B, Covid) Anterior Nasal Swab     Status: Abnormal   Collection Time: 11/06/22  6:23 PM   Specimen: Anterior Nasal Swab  Result Value Ref Range Status   SARS Coronavirus 2 by RT PCR POSITIVE (A) NEGATIVE Final    Comment: (NOTE) SARS-CoV-2 target nucleic acids are DETECTED.  The SARS-CoV-2 RNA is generally detectable in upper respiratory specimens during the acute phase of infection. Positive results are indicative of the presence of the identified virus, but do not rule out bacterial infection or co-infection with other pathogens not detected by the test. Clinical correlation with patient history and other diagnostic information is necessary to determine patient infection status. The expected result is Negative.  Fact Sheet for Patients: BloggerCourse.com  Fact Sheet for Healthcare Providers: SeriousBroker.it  This test is not yet approved or cleared by the [place]  FDA and  has been authorized for detection and/or diagnosis of SARS-CoV-2 by FDA under an Emergency Use Authorization (EUA).  This EUA will remain in effect (meaning this test can be used) for the duration of  the COVID-19 declaration under Section 564(b)(1) of the A ct, 21 U.S.C. section 360bbb-3(b)(1), unless the authorization is terminated or revoked sooner.      Influenza A by PCR NEGATIVE NEGATIVE Final   Influenza B by PCR NEGATIVE NEGATIVE Final    Comment: (NOTE) The Xpert Xpress SARS-CoV-2/FLU/RSV plus assay is intended as an aid in the diagnosis of influenza from Nasopharyngeal swab specimens and should not be used as a sole basis for treatment. Nasal washings and aspirates are unacceptable for Xpert Xpress SARS-CoV-2/FLU/RSV testing.  Fact Sheet for Patients: BloggerCourse.com  Fact Sheet for Healthcare Providers: SeriousBroker.it  This test is not yet approved or cleared by the [place]  FDA and has been authorized for detection and/or diagnosis of SARS-CoV-2 by FDA under an Emergency Use Authorization (EUA). This EUA will remain in effect (meaning this test can be used) for the duration of the COVID-19 declaration under Section 564(b)(1) of the Act, 21 U.S.C. section 360bbb-3(b)(1), unless the authorization is terminated or revoked.  Performed at [facility], [address] [place], [place] [address]    Imaging 6/15 venous US  of rt leg negative for DVT  6/15 CXR 1. No acute cardiopulmonary abnormality. 2. Rounded lucency along the inferior LEFT paramediastinal border may reflect a hiatal hernia versus prominent stomach gas.   Assessment/Plan # Traumatic wound of RT lower leg with associated cellulitus  - has completed several rounds of antibiotics and improved  - no need for further antibiotics - fu as needed/worsening symptoms   # Elevated BP/HTN - no symptoms and signs of hypertensive emergency but highly recommended for compliance with medications, close fu with PCP.   I spent 60 minutes involved in face-to-face and non-face-to-face activities for this patient on the day of the visit. Professional time spent includes the following activities: Preparing to see the patient (review of tests), Obtaining and reviewing separately obtained history (admission/discharge  record 6/17), Performing a medically appropriate examination and evaluation,  Counseling at least 5 minutes regarding elevated BP, Documenting clinical information  in the EMR, Independently interpreting results (not separately reported), Communicating results to the patient, Counseling and educating the patient.  Of note, portions of this note may have been created with voice recognition software. While this note has been edited for accuracy, occasional wrong-word or 'sound-a-like' substitutions may have occurred due to the inherent limitations of voice recognition software.   Annalee Joseph, MD [facility] 07/16/2024, 3:38 PM

## 2024-07-31 ENCOUNTER — Telehealth: Payer: Self-pay

## 2024-07-31 MED ORDER — CEFADROXIL 500 MG PO CAPS
1000.0000 mg | ORAL_CAPSULE | Freq: Two times a day (BID) | ORAL | 0 refills | Status: AC
Start: 2024-07-31 — End: 2024-08-07

## 2024-07-31 NOTE — Telephone Encounter (Signed)
 Prescription sent to [facility]. Pt updated. Lorenda CHRISTELLA Code, RMA

## 2024-07-31 NOTE — Telephone Encounter (Signed)
 Patient called office stating he has some concerns regarding right leg.  States that around 4 days ago he noticed his right leg near wound looking worse. Is red and warm to the touch. Requested to be seen by Dr. Dea this week for evaluation. Scheduled appt for 8/14. Would like to know if provider can send in something to [facility] on [address], [address] [place], [place]

## 2024-07-31 NOTE — Addendum Note (Signed)
 Addended by: Thanh Pomerleau M on: 07/31/2024 03:44 PM   Modules accepted: Orders

## 2024-08-02 ENCOUNTER — Ambulatory Visit: Admitting: Infectious Diseases

## 2024-08-02 ENCOUNTER — Encounter: Payer: Self-pay | Admitting: Infectious Diseases

## 2024-08-02 ENCOUNTER — Other Ambulatory Visit: Payer: Self-pay

## 2024-08-02 VITALS — BP 201/73 | HR 75 | Temp 98.0°F | Ht 69.0 in | Wt 205.0 lb

## 2024-08-02 DIAGNOSIS — S81801D Unspecified open wound, right lower leg, subsequent encounter: Secondary | ICD-10-CM | POA: Diagnosis not present

## 2024-08-02 DIAGNOSIS — L03115 Cellulitis of right lower limb: Secondary | ICD-10-CM

## 2024-08-02 DIAGNOSIS — Z79899 Other long term (current) drug therapy: Secondary | ICD-10-CM

## 2024-08-02 DIAGNOSIS — I10 Essential (primary) hypertension: Secondary | ICD-10-CM | POA: Diagnosis not present

## 2024-08-02 NOTE — Progress Notes (Signed)
 Patient Active Problem List   Diagnosis Date Noted   Medication management 07/16/2024   Wound of right leg 07/16/2024   Cellulitis of right lower extremity 06/03/2024   Cellulitis of right leg 06/02/2024   Essential hypertension 06/02/2024   Hyperlipidemia 06/02/2024   Hypertensive urgency 06/02/2024   Right leg swelling 06/02/2024   Current Outpatient Medications on File Prior to Visit  Medication Sig Dispense Refill   albuterol  (VENTOLIN  HFA) 108 (90 Base) MCG/ACT inhaler Inhale 2 puffs into the lungs 2 (two) times daily as needed for wheezing or shortness of breath.     ascorbic acid  (VITAMIN C ) 500 MG tablet Take 500 mg by mouth daily.     cefadroxil  (DURICEF) 500 MG capsule Take 2 capsules (1,000 mg total) by mouth 2 (two) times daily for 7 days. 28 capsule 0   chlorthalidone  (HYGROTON ) 25 MG tablet Take 25 mg by mouth daily.     cyanocobalamin  (VITAMIN B12) 1000 MCG tablet Take 1,000 mcg by mouth daily.     ELDERBERRY PO Take by mouth.     Omega-3 Fatty Acids (FISH OIL) 500 MG CAPS Take 1 capsule by mouth daily.     Tiotropium Bromide-Olodaterol 2.5-2.5 MCG/ACT AERS Inhale 2 each into the lungs daily.     Turmeric (QC TUMERIC COMPLEX PO) Take by mouth.     Zinc  50 MG TABS Take 1 tablet by mouth daily.     No current facility-administered medications on file prior to visit.   Subjective: Discussed the use of AI scribe software for clinical note transcription with the patient, who gave verbal consent to proceed.   78 Y O male with prior h/o HTN, HLD, COPD who is referred for HFU after recent hospital admission 6/14-6/17 for Rt leg cellulitis.   He reports sustaining injury to his rt lower leg during a [place] approximately three months ago in April. The wound was not initially noticed due to a concurrent back injury and was not dressed for about 48 hours, likely leading to the infection.  He first sought medical attention at the [facility] when the wound  became infected and was prescribed doxycycline  for seven days. After a week-long trip to the [place], he returned to the [place] as the infection persisted. He was given another course of antibiotics ( doxycycline  and cephalexin  for 7 days) , which he took for a week, but it did not seem effective. There was later concerns for developing sepsis and, he went to [facility] and was admitted in mid June where he received intravenous antibiotics with Vancomycin  and ceftriaxone . US  was negative for DVT. He also had some issues with management of HTN while hospitalized. He was discharged on 6/17 with cephalexin  for 7 days.   Reports he has completed another round of 2 weeks course of  cephalexin  250mg  po qid and bactrim bid prescribed on 7/7 by another provider after completing the course of cephalexin  prescribed during discharge, which she reports completed last Sunday and reports swelling and redness have subsided.  Hs initial BP was high in the clinic with systolic in 170s and says he had not taken his meds. He however explains he knows how to take care of BP, he focuses on healthy diet and exercise for maintaining his BP and that helps more than his medications. Denies having chest pain, sob, headache.  Discussed importance to be compliant with his medications.   8/14 Reports wound in the rt lower leg reopened approximately 5 to  6 days ago, with associated swelling and oozing of a sticky substance. Denies recent trauma or injury to the area, and the wound opened spontaneously. He denies fever, chills, nausea, or vomiting. He has been more active recently but has not engaged in any unusual activities. He is currently taking cefadroxil , two tablets twice a day. Has a fu with PCP tomorrow. No other complaints.   Review of Systems: all systems reviewed with pertinent positives and negatives as listed above   Past Medical History:  Diagnosis Date   COPD (chronic obstructive pulmonary disease) (HCC)    Hypertension    Past  Surgical History:  Procedure Laterality Date   CLAVICLE SURGERY     Social History   Tobacco Use   Smoking status: Former    Current packs/day: 0.00    Average packs/day: 1 pack/day for 15.0 years (15.0 ttl pk-yrs)    Types: Cigarettes    Start date: 51    Quit date: 83    Years since quitting: 32.6   Smokeless tobacco: Never  Vaping Use   Vaping status: Never Used  Substance Use Topics   Alcohol use: Not Currently   Drug use: Never    Family History  Problem Relation Age of Onset   Hypertension Father     No Known Allergies  Health Maintenance  Topic Date Due   Medicare Annual Wellness (AWV)  Never done   COVID-19 Vaccine (1) Never done   Hepatitis C Screening  Never done   DTaP/Tdap/Td (1 - Tdap) Never done   Zoster Vaccines- Shingrix (1 of 2) Never done   Pneumococcal Vaccine: 50+ Years (1 of 1 - PCV) Never done   INFLUENZA VACCINE  07/20/2024   Hepatitis B Vaccines  Aged Out   HPV VACCINES  Aged Out   Meningococcal B Vaccine  Aged Out    Objective: BP (!) 201/73 Comment: provider notified  Pulse 75   Temp 98 F (36.7 C) (Temporal)   Ht 5' 9 (1.753 m)   Wt 205 lb (93 kg)   SpO2 96%   BMI 30.27 kg/m     Physical Exam Constitutional:      Appearance: Normal appearance.  HENT:     Head: Normocephalic and atraumatic.      Mouth: Mucous membranes are moist. Multiple missing teeth Eyes:    Conjunctiva/sclera: Conjunctivae normal.     Pupils: Pupils are equal, round, and b/l symmetrical    Cardiovascular:     Rate and Rhythm: Normal rate and regular rhythm.     Heart sounds:  Pulmonary:     Effort: Pulmonary effort is normal.     Breath sounds:  Abdominal:     General: Non distended     Palpations:   Musculoskeletal:        General: Normal range of motion.   Skin:    General: Skin is warm and dry.     Comments:  Rt lower leg wound with minimal redness, no warmth, tenderness, fluctuance or crepitus. No signs of septic ankle or knee.  No signs of nec fasc. No drainage. Ambulatory     Neurological:     General: grossly non focal     Mental Status: awake, alert and oriented to person, place, and time.   Psychiatric:        Mood and Affect: Mood normal.   Lab Results Lab Results  Component Value Date   WBC 9.5 06/03/2024   HGB 15.3 06/03/2024   HCT 44.6 06/03/2024  MCV 93.3 06/03/2024   PLT 296 06/03/2024    Lab Results  Component Value Date   CREATININE 0.64 06/04/2024   BUN 13 06/03/2024   NA 140 06/03/2024   K 3.8 06/03/2024   CL 108 06/03/2024   CO2 25 06/03/2024    Lab Results  Component Value Date   ALT 10 06/02/2024   AST 19 06/02/2024   ALKPHOS 65 06/02/2024   BILITOT 0.6 06/02/2024    No results found for: CHOL, HDL, LDLCALC, LDLDIRECT, TRIG, CHOLHDL No results found for: LABRPR, RPRTITER No results found for: HIV1RNAQUANT, HIV1RNAVL, CD4TABS   Microbiology Results for orders placed or performed during the hospital encounter of 11/06/22  Resp Panel by RT-PCR (Flu A&B, Covid) Anterior Nasal Swab     Status: Abnormal   Collection Time: 11/06/22  6:23 PM   Specimen: Anterior Nasal Swab  Result Value Ref Range Status   SARS Coronavirus 2 by RT PCR POSITIVE (A) NEGATIVE Final    Comment: (NOTE) SARS-CoV-2 target nucleic acids are DETECTED.  The SARS-CoV-2 RNA is generally detectable in upper respiratory specimens during the acute phase of infection. Positive results are indicative of the presence of the identified virus, but do not rule out bacterial infection or co-infection with other pathogens not detected by the test. Clinical correlation with patient history and other diagnostic information is necessary to determine patient infection status. The expected result is Negative.  Fact Sheet for Patients: BloggerCourse.com  Fact Sheet for Healthcare Providers: SeriousBroker.it  This test is not yet approved or  cleared by the [place]  FDA and  has been authorized for detection and/or diagnosis of SARS-CoV-2 by FDA under an Emergency Use Authorization (EUA).  This EUA will remain in effect (meaning this test can be used) for the duration of  the COVID-19 declaration under Section 564(b)(1) of the A ct, 21 U.S.C. section 360bbb-3(b)(1), unless the authorization is terminated or revoked sooner.     Influenza A by PCR NEGATIVE NEGATIVE Final   Influenza B by PCR NEGATIVE NEGATIVE Final    Comment: (NOTE) The Xpert Xpress SARS-CoV-2/FLU/RSV plus assay is intended as an aid in the diagnosis of influenza from Nasopharyngeal swab specimens and should not be used as a sole basis for treatment. Nasal washings and aspirates are unacceptable for Xpert Xpress SARS-CoV-2/FLU/RSV testing.  Fact Sheet for Patients: BloggerCourse.com  Fact Sheet for Healthcare Providers: SeriousBroker.it  This test is not yet approved or cleared by the [place]  FDA and has been authorized for detection and/or diagnosis of SARS-CoV-2 by FDA under an Emergency Use Authorization (EUA). This EUA will remain in effect (meaning this test can be used) for the duration of the COVID-19 declaration under Section 564(b)(1) of the Act, 21 U.S.C. section 360bbb-3(b)(1), unless the authorization is terminated or revoked.  Performed at [facility], [address], [place], [place] [address]    Imaging 6/15 venous US  of rt leg negative for DVT  6/15 CXR 1. No acute cardiopulmonary abnormality. 2. Rounded lucency along the inferior LEFT paramediastinal border may reflect a hiatal hernia versus prominent stomach gas.   Assessment/Plan # Traumatic wound of RT lower leg with associated cellulitus  - has completed several rounds of antibiotics and improved  - wound reopened 5 to 6 days ago with some  redness and swelling( non purulent) spontaneously   Plan - continue  cefadroxil  1g po bid for 7 days  - CBC and CRP  - Amb referral to wound care center - fu as needed,  pending labs/worsening symptoms    # Elevated BP/HTN - no signs of emergency -  highly recommended for compliance with medications, close fu with PCP.   I spent 25 minutes involved in face-to-face and non-face-to-face activities for this patient on the day of the visit. Professional time spent includes the following activities: Preparing to see the patient (review of tests), Obtaining and reviewing separately obtained history (Progress note 7/28), Performing a medically appropriate examination and evaluation,  Counseling regarding elevated BP, Documenting clinical information in the EMR, Independently interpreting results (not separately reported), Communicating results to the patient, Counseling and educating the patient.  Of note, portions of this note may have been created with voice recognition software. While this note has been edited for accuracy, occasional wrong-word or 'sound-a-like' substitutions may have occurred due to the inherent limitations of voice recognition software.   Annalee Joseph, MD [facility] 08/02/2024, 8:24 AM

## 2024-08-03 LAB — CBC
HCT: 46.1 % (ref 38.5–50.0)
Hemoglobin: 15.4 g/dL (ref 13.2–17.1)
MCH: 32.1 pg (ref 27.0–33.0)
MCHC: 33.4 g/dL (ref 32.0–36.0)
MCV: 96 fL (ref 80.0–100.0)
MPV: 9.1 fL (ref 7.5–12.5)
Platelets: 238 Thousand/uL (ref 140–400)
RBC: 4.8 Million/uL (ref 4.20–5.80)
RDW: 13 % (ref 11.0–15.0)
WBC: 7.3 Thousand/uL (ref 3.8–10.8)

## 2024-08-03 LAB — C-REACTIVE PROTEIN: CRP: <3 mg/L (ref <8.0)

## 2024-08-06 ENCOUNTER — Ambulatory Visit: Payer: Self-pay | Admitting: Infectious Diseases

## 2024-08-06 ENCOUNTER — Telehealth: Payer: Self-pay

## 2024-08-06 MED ORDER — CEFADROXIL 500 MG PO CAPS: 1000.0000 mg | ORAL_CAPSULE | Freq: Two times a day (BID) | ORAL | 0 refills | Status: AC

## 2024-08-06 NOTE — Telephone Encounter (Signed)
 Updated pt that 7 additional days of Cefadroxil  was called into pharmacy. Explained to pt that MD does believe he will need additional antibiotics for cellulitis. Understands to call office if symptoms get worse. Lorenda CHRISTELLA Code, RMA

## 2024-08-06 NOTE — Addendum Note (Signed)
 Addended by: Manvir Prabhu M on: 08/06/2024 11:00 AM   Modules accepted: Orders

## 2024-08-06 NOTE — Telephone Encounter (Signed)
 Patient called office stating he is not able to get into wound care until 9/15. Would like to know if he can have refills on Cefadroxil . Is still having concerns regarding right leg. Feels like he has some improvement.   Lorenda CHRISTELLA Code, RMA

## 2024-08-17 ENCOUNTER — Other Ambulatory Visit: Payer: Self-pay

## 2024-08-17 MED ORDER — CEFADROXIL 500 MG PO CAPS: 1000.0000 mg | ORAL_CAPSULE | Freq: Two times a day (BID) | ORAL | 0 refills | Status: AC

## 2024-08-17 NOTE — Telephone Encounter (Signed)
 Rx sent for 7 day supply. Appt with wound care 08/22/24

## 2024-08-17 NOTE — Telephone Encounter (Signed)
 Patient came by the office today requesting refill on cefadroxil  until appointment with wound care. A 7 day supply was only sent in and he is out now. Please advise. D'Arcy Abraha ONEIDA Ligas, CMA

## 2024-08-17 NOTE — Telephone Encounter (Signed)
 Can send 1 refill only for cefadroxil .

## 2024-08-22 ENCOUNTER — Encounter: Attending: Physician Assistant | Admitting: Physician Assistant

## 2024-08-22 DIAGNOSIS — I10 Essential (primary) hypertension: Secondary | ICD-10-CM | POA: Diagnosis not present

## 2024-08-22 DIAGNOSIS — J4489 Other specified chronic obstructive pulmonary disease: Secondary | ICD-10-CM | POA: Insufficient documentation

## 2024-08-22 DIAGNOSIS — L988 Other specified disorders of the skin and subcutaneous tissue: Secondary | ICD-10-CM | POA: Insufficient documentation

## 2024-08-22 DIAGNOSIS — L03115 Cellulitis of right lower limb: Secondary | ICD-10-CM | POA: Insufficient documentation

## 2024-09-03 ENCOUNTER — Ambulatory Visit: Admitting: Physician Assistant

## 2024-09-18 ENCOUNTER — Encounter: Admitting: Physician Assistant

## 2024-09-18 DIAGNOSIS — I10 Essential (primary) hypertension: Secondary | ICD-10-CM | POA: Diagnosis not present

## 2024-09-18 DIAGNOSIS — J4489 Other specified chronic obstructive pulmonary disease: Secondary | ICD-10-CM | POA: Diagnosis not present

## 2024-09-18 DIAGNOSIS — L03115 Cellulitis of right lower limb: Secondary | ICD-10-CM | POA: Diagnosis not present

## 2024-09-18 DIAGNOSIS — L988 Other specified disorders of the skin and subcutaneous tissue: Secondary | ICD-10-CM | POA: Diagnosis not present

## 2024-10-02 ENCOUNTER — Encounter: Attending: Physician Assistant | Admitting: Physician Assistant

## 2024-10-02 DIAGNOSIS — I10 Essential (primary) hypertension: Secondary | ICD-10-CM | POA: Diagnosis not present

## 2024-10-02 DIAGNOSIS — L03115 Cellulitis of right lower limb: Secondary | ICD-10-CM | POA: Diagnosis not present

## 2024-10-02 DIAGNOSIS — Z09 Encounter for follow-up examination after completed treatment for conditions other than malignant neoplasm: Secondary | ICD-10-CM | POA: Diagnosis not present

## 2024-10-02 DIAGNOSIS — J449 Chronic obstructive pulmonary disease, unspecified: Secondary | ICD-10-CM | POA: Diagnosis not present

## 2024-10-02 DIAGNOSIS — Z872 Personal history of diseases of the skin and subcutaneous tissue: Secondary | ICD-10-CM | POA: Diagnosis not present
